# Patient Record
Sex: Male | Born: 1940 | Race: White | Hispanic: No | Marital: Married | Smoking: Former smoker
Health system: Southern US, Community
[De-identification: ages and names within clinical notes are randomized; demographics above are authoritative.]

## PROBLEM LIST (undated history)

## (undated) DIAGNOSIS — M545 Low back pain, unspecified: Secondary | ICD-10-CM

## (undated) DIAGNOSIS — C4491 Basal cell carcinoma of skin, unspecified: Secondary | ICD-10-CM

## (undated) DIAGNOSIS — R091 Pleurisy: Secondary | ICD-10-CM

## (undated) HISTORY — DX: Low back pain: M54.5

## (undated) HISTORY — PX: APPENDECTOMY: SHX54

## (undated) HISTORY — DX: Basal cell carcinoma of skin, unspecified: C44.91

## (undated) HISTORY — DX: Low back pain, unspecified: M54.50

## (undated) HISTORY — DX: Pleurisy: R09.1

---

## 2004-12-08 ENCOUNTER — Ambulatory Visit: Payer: Self-pay | Admitting: General Surgery

## 2004-12-08 LAB — HM COLONOSCOPY

## 2008-11-02 ENCOUNTER — Ambulatory Visit: Payer: Self-pay | Admitting: Family Medicine

## 2008-12-20 DIAGNOSIS — I10 Essential (primary) hypertension: Secondary | ICD-10-CM | POA: Insufficient documentation

## 2009-11-01 DIAGNOSIS — M109 Gout, unspecified: Secondary | ICD-10-CM | POA: Insufficient documentation

## 2009-11-06 DIAGNOSIS — IMO0002 Reserved for concepts with insufficient information to code with codable children: Secondary | ICD-10-CM | POA: Insufficient documentation

## 2009-11-29 ENCOUNTER — Ambulatory Visit: Payer: Self-pay | Admitting: Family Medicine

## 2009-11-29 DIAGNOSIS — R091 Pleurisy: Secondary | ICD-10-CM

## 2009-11-29 HISTORY — DX: Pleurisy: R09.1

## 2010-01-23 DIAGNOSIS — G4700 Insomnia, unspecified: Secondary | ICD-10-CM | POA: Insufficient documentation

## 2011-08-23 ENCOUNTER — Ambulatory Visit: Payer: Self-pay | Admitting: General Surgery

## 2011-10-09 DIAGNOSIS — L819 Disorder of pigmentation, unspecified: Secondary | ICD-10-CM | POA: Diagnosis not present

## 2011-10-09 DIAGNOSIS — D239 Other benign neoplasm of skin, unspecified: Secondary | ICD-10-CM | POA: Diagnosis not present

## 2011-10-09 DIAGNOSIS — D1801 Hemangioma of skin and subcutaneous tissue: Secondary | ICD-10-CM | POA: Diagnosis not present

## 2011-10-09 DIAGNOSIS — L570 Actinic keratosis: Secondary | ICD-10-CM | POA: Diagnosis not present

## 2011-10-09 DIAGNOSIS — Z85828 Personal history of other malignant neoplasm of skin: Secondary | ICD-10-CM | POA: Diagnosis not present

## 2011-10-09 DIAGNOSIS — L738 Other specified follicular disorders: Secondary | ICD-10-CM | POA: Diagnosis not present

## 2011-10-09 DIAGNOSIS — L408 Other psoriasis: Secondary | ICD-10-CM | POA: Diagnosis not present

## 2011-10-09 DIAGNOSIS — L821 Other seborrheic keratosis: Secondary | ICD-10-CM | POA: Diagnosis not present

## 2011-10-09 DIAGNOSIS — D485 Neoplasm of uncertain behavior of skin: Secondary | ICD-10-CM | POA: Diagnosis not present

## 2011-11-09 DIAGNOSIS — N400 Enlarged prostate without lower urinary tract symptoms: Secondary | ICD-10-CM | POA: Diagnosis not present

## 2011-11-09 DIAGNOSIS — Z0000 Encounter for general adult medical examination without abnormal findings: Secondary | ICD-10-CM | POA: Diagnosis not present

## 2011-11-09 DIAGNOSIS — I10 Essential (primary) hypertension: Secondary | ICD-10-CM | POA: Diagnosis not present

## 2011-11-09 DIAGNOSIS — N529 Male erectile dysfunction, unspecified: Secondary | ICD-10-CM | POA: Diagnosis not present

## 2011-11-09 DIAGNOSIS — Z13220 Encounter for screening for lipoid disorders: Secondary | ICD-10-CM | POA: Diagnosis not present

## 2011-11-09 DIAGNOSIS — Z125 Encounter for screening for malignant neoplasm of prostate: Secondary | ICD-10-CM | POA: Diagnosis not present

## 2012-04-08 DIAGNOSIS — D1801 Hemangioma of skin and subcutaneous tissue: Secondary | ICD-10-CM | POA: Diagnosis not present

## 2012-04-08 DIAGNOSIS — D239 Other benign neoplasm of skin, unspecified: Secondary | ICD-10-CM | POA: Diagnosis not present

## 2012-04-08 DIAGNOSIS — L570 Actinic keratosis: Secondary | ICD-10-CM | POA: Diagnosis not present

## 2012-04-08 DIAGNOSIS — D485 Neoplasm of uncertain behavior of skin: Secondary | ICD-10-CM | POA: Diagnosis not present

## 2012-04-08 DIAGNOSIS — L819 Disorder of pigmentation, unspecified: Secondary | ICD-10-CM | POA: Diagnosis not present

## 2012-04-08 DIAGNOSIS — L821 Other seborrheic keratosis: Secondary | ICD-10-CM | POA: Diagnosis not present

## 2012-04-08 DIAGNOSIS — L408 Other psoriasis: Secondary | ICD-10-CM | POA: Diagnosis not present

## 2012-04-08 DIAGNOSIS — L719 Rosacea, unspecified: Secondary | ICD-10-CM | POA: Diagnosis not present

## 2012-04-08 DIAGNOSIS — Z85828 Personal history of other malignant neoplasm of skin: Secondary | ICD-10-CM | POA: Diagnosis not present

## 2012-05-22 DIAGNOSIS — R339 Retention of urine, unspecified: Secondary | ICD-10-CM | POA: Diagnosis not present

## 2012-05-22 DIAGNOSIS — I10 Essential (primary) hypertension: Secondary | ICD-10-CM | POA: Diagnosis not present

## 2012-05-22 DIAGNOSIS — R42 Dizziness and giddiness: Secondary | ICD-10-CM | POA: Diagnosis not present

## 2012-05-22 DIAGNOSIS — R5383 Other fatigue: Secondary | ICD-10-CM | POA: Diagnosis not present

## 2012-05-22 DIAGNOSIS — R5381 Other malaise: Secondary | ICD-10-CM | POA: Diagnosis not present

## 2012-06-06 DIAGNOSIS — R351 Nocturia: Secondary | ICD-10-CM | POA: Diagnosis not present

## 2012-06-06 DIAGNOSIS — R339 Retention of urine, unspecified: Secondary | ICD-10-CM | POA: Diagnosis not present

## 2012-06-06 DIAGNOSIS — N401 Enlarged prostate with lower urinary tract symptoms: Secondary | ICD-10-CM | POA: Diagnosis not present

## 2012-06-08 DIAGNOSIS — R351 Nocturia: Secondary | ICD-10-CM | POA: Insufficient documentation

## 2012-07-02 DIAGNOSIS — D485 Neoplasm of uncertain behavior of skin: Secondary | ICD-10-CM | POA: Diagnosis not present

## 2012-07-02 DIAGNOSIS — L723 Sebaceous cyst: Secondary | ICD-10-CM | POA: Diagnosis not present

## 2012-07-02 DIAGNOSIS — Z23 Encounter for immunization: Secondary | ICD-10-CM | POA: Diagnosis not present

## 2012-07-11 DIAGNOSIS — N401 Enlarged prostate with lower urinary tract symptoms: Secondary | ICD-10-CM | POA: Diagnosis not present

## 2012-07-11 DIAGNOSIS — R339 Retention of urine, unspecified: Secondary | ICD-10-CM | POA: Diagnosis not present

## 2012-09-11 DIAGNOSIS — Z87891 Personal history of nicotine dependence: Secondary | ICD-10-CM | POA: Diagnosis not present

## 2012-09-11 DIAGNOSIS — H8110 Benign paroxysmal vertigo, unspecified ear: Secondary | ICD-10-CM | POA: Insufficient documentation

## 2012-09-11 DIAGNOSIS — L409 Psoriasis, unspecified: Secondary | ICD-10-CM | POA: Insufficient documentation

## 2012-09-11 DIAGNOSIS — J329 Chronic sinusitis, unspecified: Secondary | ICD-10-CM | POA: Diagnosis not present

## 2012-09-11 DIAGNOSIS — Z8042 Family history of malignant neoplasm of prostate: Secondary | ICD-10-CM | POA: Insufficient documentation

## 2012-09-11 DIAGNOSIS — Z6829 Body mass index (BMI) 29.0-29.9, adult: Secondary | ICD-10-CM | POA: Diagnosis not present

## 2012-10-02 DIAGNOSIS — J3489 Other specified disorders of nose and nasal sinuses: Secondary | ICD-10-CM | POA: Diagnosis not present

## 2012-10-02 DIAGNOSIS — Z6830 Body mass index (BMI) 30.0-30.9, adult: Secondary | ICD-10-CM | POA: Diagnosis not present

## 2012-10-02 DIAGNOSIS — Z87891 Personal history of nicotine dependence: Secondary | ICD-10-CM | POA: Diagnosis not present

## 2012-11-14 DIAGNOSIS — Z85828 Personal history of other malignant neoplasm of skin: Secondary | ICD-10-CM | POA: Diagnosis not present

## 2012-11-14 DIAGNOSIS — D1801 Hemangioma of skin and subcutaneous tissue: Secondary | ICD-10-CM | POA: Diagnosis not present

## 2012-11-14 DIAGNOSIS — D239 Other benign neoplasm of skin, unspecified: Secondary | ICD-10-CM | POA: Diagnosis not present

## 2012-11-14 DIAGNOSIS — L821 Other seborrheic keratosis: Secondary | ICD-10-CM | POA: Diagnosis not present

## 2012-11-14 DIAGNOSIS — L819 Disorder of pigmentation, unspecified: Secondary | ICD-10-CM | POA: Diagnosis not present

## 2012-11-14 DIAGNOSIS — L408 Other psoriasis: Secondary | ICD-10-CM | POA: Diagnosis not present

## 2012-11-14 DIAGNOSIS — L719 Rosacea, unspecified: Secondary | ICD-10-CM | POA: Diagnosis not present

## 2012-12-19 DIAGNOSIS — Z0000 Encounter for general adult medical examination without abnormal findings: Secondary | ICD-10-CM | POA: Diagnosis not present

## 2012-12-19 DIAGNOSIS — R339 Retention of urine, unspecified: Secondary | ICD-10-CM | POA: Diagnosis not present

## 2012-12-19 DIAGNOSIS — Z1331 Encounter for screening for depression: Secondary | ICD-10-CM | POA: Diagnosis not present

## 2012-12-19 DIAGNOSIS — Z125 Encounter for screening for malignant neoplasm of prostate: Secondary | ICD-10-CM | POA: Diagnosis not present

## 2012-12-19 DIAGNOSIS — I10 Essential (primary) hypertension: Secondary | ICD-10-CM | POA: Diagnosis not present

## 2012-12-19 DIAGNOSIS — R5383 Other fatigue: Secondary | ICD-10-CM | POA: Diagnosis not present

## 2012-12-19 DIAGNOSIS — Z1339 Encounter for screening examination for other mental health and behavioral disorders: Secondary | ICD-10-CM | POA: Diagnosis not present

## 2013-01-12 DIAGNOSIS — H40039 Anatomical narrow angle, unspecified eye: Secondary | ICD-10-CM | POA: Diagnosis not present

## 2013-01-12 DIAGNOSIS — H10029 Other mucopurulent conjunctivitis, unspecified eye: Secondary | ICD-10-CM | POA: Diagnosis not present

## 2013-03-05 DIAGNOSIS — N401 Enlarged prostate with lower urinary tract symptoms: Secondary | ICD-10-CM | POA: Diagnosis not present

## 2013-03-05 DIAGNOSIS — R339 Retention of urine, unspecified: Secondary | ICD-10-CM | POA: Diagnosis not present

## 2013-04-07 ENCOUNTER — Ambulatory Visit: Payer: Self-pay | Admitting: Family Medicine

## 2013-04-07 DIAGNOSIS — M79609 Pain in unspecified limb: Secondary | ICD-10-CM | POA: Diagnosis not present

## 2013-04-07 DIAGNOSIS — I10 Essential (primary) hypertension: Secondary | ICD-10-CM | POA: Diagnosis not present

## 2013-04-07 DIAGNOSIS — J309 Allergic rhinitis, unspecified: Secondary | ICD-10-CM | POA: Diagnosis not present

## 2013-04-07 DIAGNOSIS — R05 Cough: Secondary | ICD-10-CM | POA: Diagnosis not present

## 2013-04-07 DIAGNOSIS — R339 Retention of urine, unspecified: Secondary | ICD-10-CM | POA: Diagnosis not present

## 2013-04-07 LAB — LIPID PANEL
BUN: 24
Creat: 1.2
GLUCOSE: 109
POTASSIUM: 4.7 mmol/L
SODIUM: 139

## 2013-05-30 DIAGNOSIS — H00029 Hordeolum internum unspecified eye, unspecified eyelid: Secondary | ICD-10-CM | POA: Diagnosis not present

## 2013-06-01 DIAGNOSIS — H00029 Hordeolum internum unspecified eye, unspecified eyelid: Secondary | ICD-10-CM | POA: Diagnosis not present

## 2013-06-03 ENCOUNTER — Encounter (INDEPENDENT_AMBULATORY_CARE_PROVIDER_SITE_OTHER): Payer: Medicare Other | Admitting: Ophthalmology

## 2013-06-03 DIAGNOSIS — I10 Essential (primary) hypertension: Secondary | ICD-10-CM | POA: Diagnosis not present

## 2013-06-03 DIAGNOSIS — H43819 Vitreous degeneration, unspecified eye: Secondary | ICD-10-CM | POA: Diagnosis not present

## 2013-06-03 DIAGNOSIS — H35039 Hypertensive retinopathy, unspecified eye: Secondary | ICD-10-CM

## 2013-06-03 DIAGNOSIS — H3530 Unspecified macular degeneration: Secondary | ICD-10-CM

## 2013-06-03 DIAGNOSIS — H2510 Age-related nuclear cataract, unspecified eye: Secondary | ICD-10-CM

## 2013-06-06 DIAGNOSIS — H16229 Keratoconjunctivitis sicca, not specified as Sjogren's, unspecified eye: Secondary | ICD-10-CM | POA: Diagnosis not present

## 2013-08-26 DIAGNOSIS — D485 Neoplasm of uncertain behavior of skin: Secondary | ICD-10-CM | POA: Diagnosis not present

## 2013-08-27 DIAGNOSIS — C44319 Basal cell carcinoma of skin of other parts of face: Secondary | ICD-10-CM | POA: Diagnosis not present

## 2013-08-31 DIAGNOSIS — J069 Acute upper respiratory infection, unspecified: Secondary | ICD-10-CM | POA: Diagnosis not present

## 2013-08-31 DIAGNOSIS — J209 Acute bronchitis, unspecified: Secondary | ICD-10-CM | POA: Diagnosis not present

## 2013-09-21 DIAGNOSIS — R05 Cough: Secondary | ICD-10-CM | POA: Diagnosis not present

## 2013-09-21 DIAGNOSIS — Z79899 Other long term (current) drug therapy: Secondary | ICD-10-CM | POA: Diagnosis not present

## 2013-09-21 DIAGNOSIS — Z6830 Body mass index (BMI) 30.0-30.9, adult: Secondary | ICD-10-CM | POA: Diagnosis not present

## 2013-09-21 DIAGNOSIS — Z87891 Personal history of nicotine dependence: Secondary | ICD-10-CM | POA: Diagnosis not present

## 2013-10-06 DIAGNOSIS — C44319 Basal cell carcinoma of skin of other parts of face: Secondary | ICD-10-CM | POA: Diagnosis not present

## 2013-11-02 DIAGNOSIS — R059 Cough, unspecified: Secondary | ICD-10-CM | POA: Diagnosis not present

## 2013-11-02 DIAGNOSIS — J309 Allergic rhinitis, unspecified: Secondary | ICD-10-CM | POA: Diagnosis not present

## 2013-11-02 DIAGNOSIS — K219 Gastro-esophageal reflux disease without esophagitis: Secondary | ICD-10-CM | POA: Diagnosis not present

## 2013-11-02 DIAGNOSIS — R339 Retention of urine, unspecified: Secondary | ICD-10-CM | POA: Diagnosis not present

## 2013-11-02 DIAGNOSIS — R05 Cough: Secondary | ICD-10-CM | POA: Diagnosis not present

## 2013-12-02 DIAGNOSIS — L570 Actinic keratosis: Secondary | ICD-10-CM | POA: Diagnosis not present

## 2013-12-02 DIAGNOSIS — D1801 Hemangioma of skin and subcutaneous tissue: Secondary | ICD-10-CM | POA: Diagnosis not present

## 2013-12-02 DIAGNOSIS — L719 Rosacea, unspecified: Secondary | ICD-10-CM | POA: Diagnosis not present

## 2013-12-02 DIAGNOSIS — D239 Other benign neoplasm of skin, unspecified: Secondary | ICD-10-CM | POA: Diagnosis not present

## 2013-12-02 DIAGNOSIS — Z85828 Personal history of other malignant neoplasm of skin: Secondary | ICD-10-CM | POA: Diagnosis not present

## 2013-12-02 DIAGNOSIS — L819 Disorder of pigmentation, unspecified: Secondary | ICD-10-CM | POA: Diagnosis not present

## 2013-12-02 DIAGNOSIS — L821 Other seborrheic keratosis: Secondary | ICD-10-CM | POA: Diagnosis not present

## 2013-12-02 DIAGNOSIS — D485 Neoplasm of uncertain behavior of skin: Secondary | ICD-10-CM | POA: Diagnosis not present

## 2013-12-03 DIAGNOSIS — L570 Actinic keratosis: Secondary | ICD-10-CM | POA: Diagnosis not present

## 2013-12-21 DIAGNOSIS — Z23 Encounter for immunization: Secondary | ICD-10-CM | POA: Diagnosis not present

## 2013-12-21 DIAGNOSIS — Z0000 Encounter for general adult medical examination without abnormal findings: Secondary | ICD-10-CM | POA: Diagnosis not present

## 2013-12-21 DIAGNOSIS — Z1339 Encounter for screening examination for other mental health and behavioral disorders: Secondary | ICD-10-CM | POA: Diagnosis not present

## 2013-12-21 DIAGNOSIS — R05 Cough: Secondary | ICD-10-CM | POA: Diagnosis not present

## 2013-12-21 DIAGNOSIS — Z1331 Encounter for screening for depression: Secondary | ICD-10-CM | POA: Diagnosis not present

## 2013-12-21 DIAGNOSIS — I10 Essential (primary) hypertension: Secondary | ICD-10-CM | POA: Diagnosis not present

## 2013-12-21 DIAGNOSIS — R339 Retention of urine, unspecified: Secondary | ICD-10-CM | POA: Diagnosis not present

## 2013-12-21 DIAGNOSIS — R059 Cough, unspecified: Secondary | ICD-10-CM | POA: Diagnosis not present

## 2013-12-21 DIAGNOSIS — J309 Allergic rhinitis, unspecified: Secondary | ICD-10-CM | POA: Diagnosis not present

## 2013-12-21 LAB — LIPID PANEL
BUN: 17
CREATININE: 1.3
Cholesterol, Total: 212
Glucose: 102
HDL: 52 mg/dL (ref 35–70)
LDL Cholesterol: 135 mg/dL
POTASSIUM: 4 mmol/L
Sodium: 139
Triglycerides: 126

## 2014-03-18 DIAGNOSIS — D485 Neoplasm of uncertain behavior of skin: Secondary | ICD-10-CM | POA: Diagnosis not present

## 2014-03-18 DIAGNOSIS — L570 Actinic keratosis: Secondary | ICD-10-CM | POA: Diagnosis not present

## 2014-03-18 DIAGNOSIS — IMO0002 Reserved for concepts with insufficient information to code with codable children: Secondary | ICD-10-CM | POA: Insufficient documentation

## 2014-03-22 DIAGNOSIS — C44621 Squamous cell carcinoma of skin of unspecified upper limb, including shoulder: Secondary | ICD-10-CM | POA: Diagnosis not present

## 2014-04-14 DIAGNOSIS — L570 Actinic keratosis: Secondary | ICD-10-CM | POA: Diagnosis not present

## 2014-04-14 DIAGNOSIS — C44621 Squamous cell carcinoma of skin of unspecified upper limb, including shoulder: Secondary | ICD-10-CM | POA: Diagnosis not present

## 2014-04-15 DIAGNOSIS — L905 Scar conditions and fibrosis of skin: Secondary | ICD-10-CM | POA: Diagnosis not present

## 2014-04-25 IMAGING — CR RIGHT HAND - COMPLETE 3+ VIEW
1 series · 3 of 3 positions shown · non-contrast
Comparison: none

REASON FOR EXAM: pain
COMMENTS:

PROCEDURE:     KDR - KDXR HAND RT COMPLETE W/OBLIQUES  - April 07, 2013  [DATE]
RESULT:     Images of the right hand demonstrate no fracture, dislocation or
radiopaque foreign body.

[Series 1: pa · 0.17mm/px · 3 of 3 slices shown]
[im 1/3]
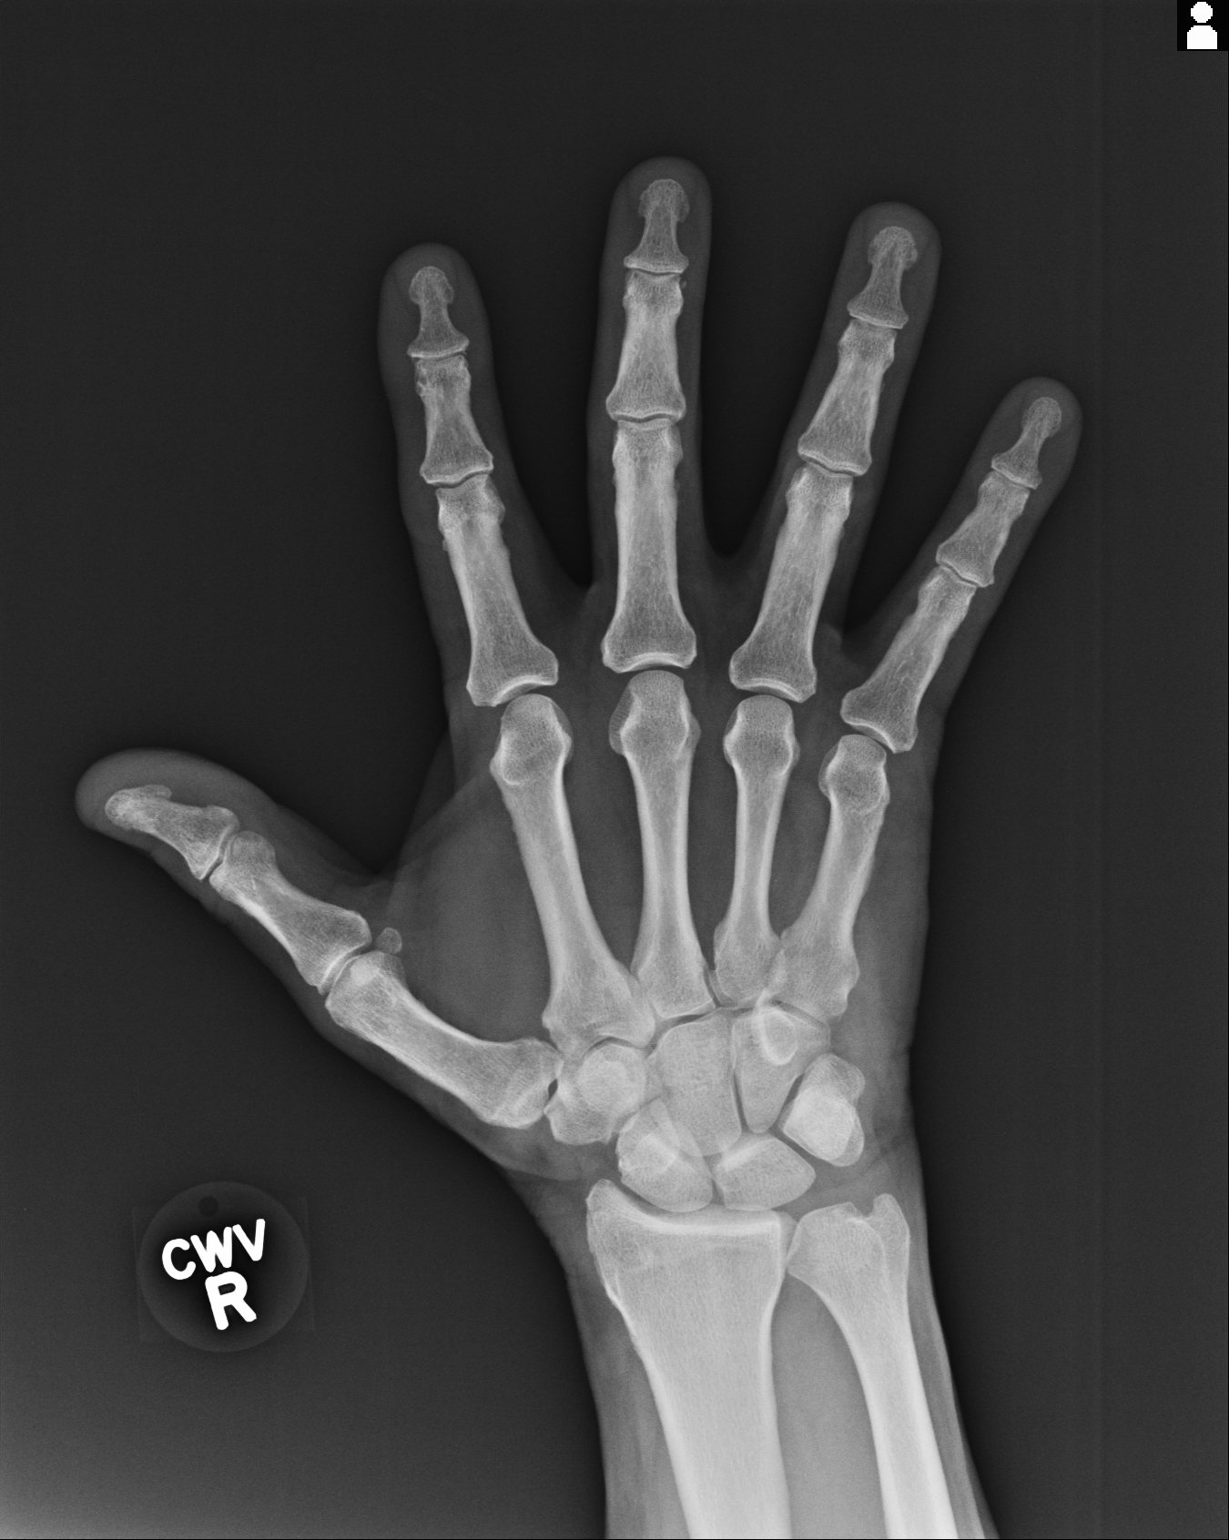
[im 2/3]
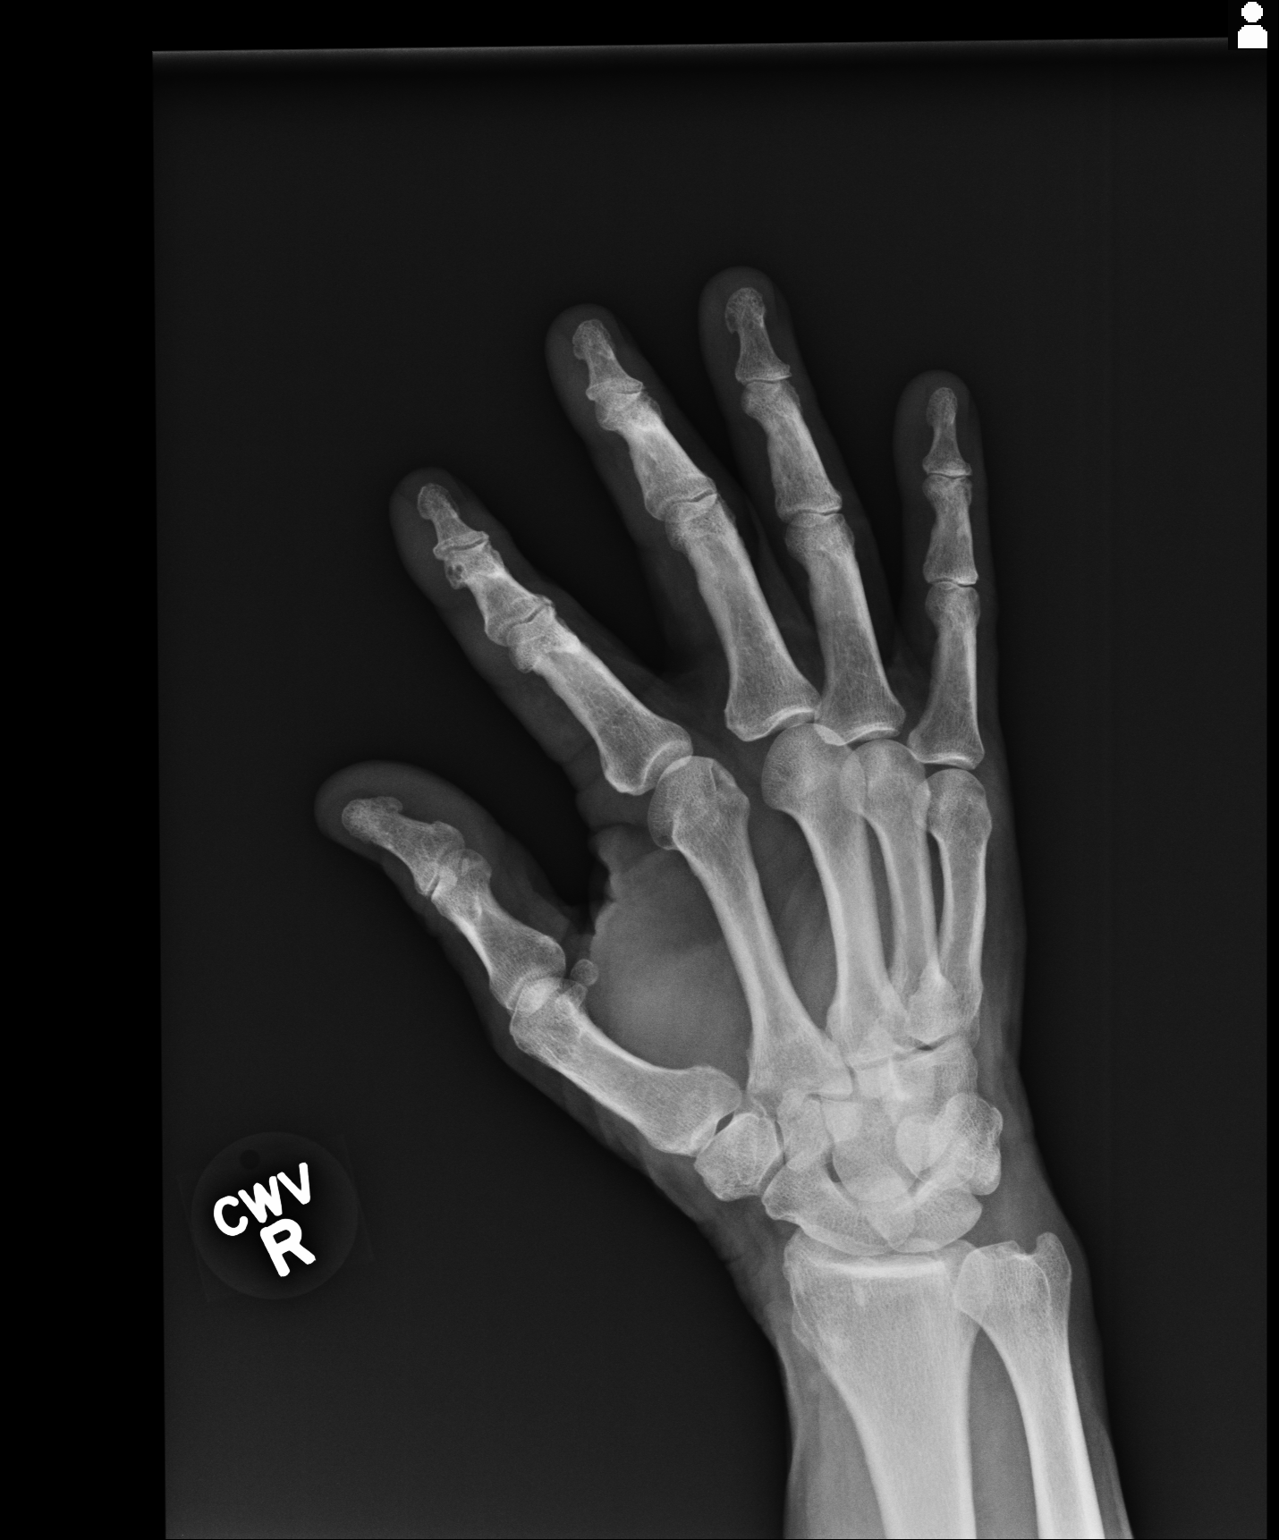
[im 3/3]
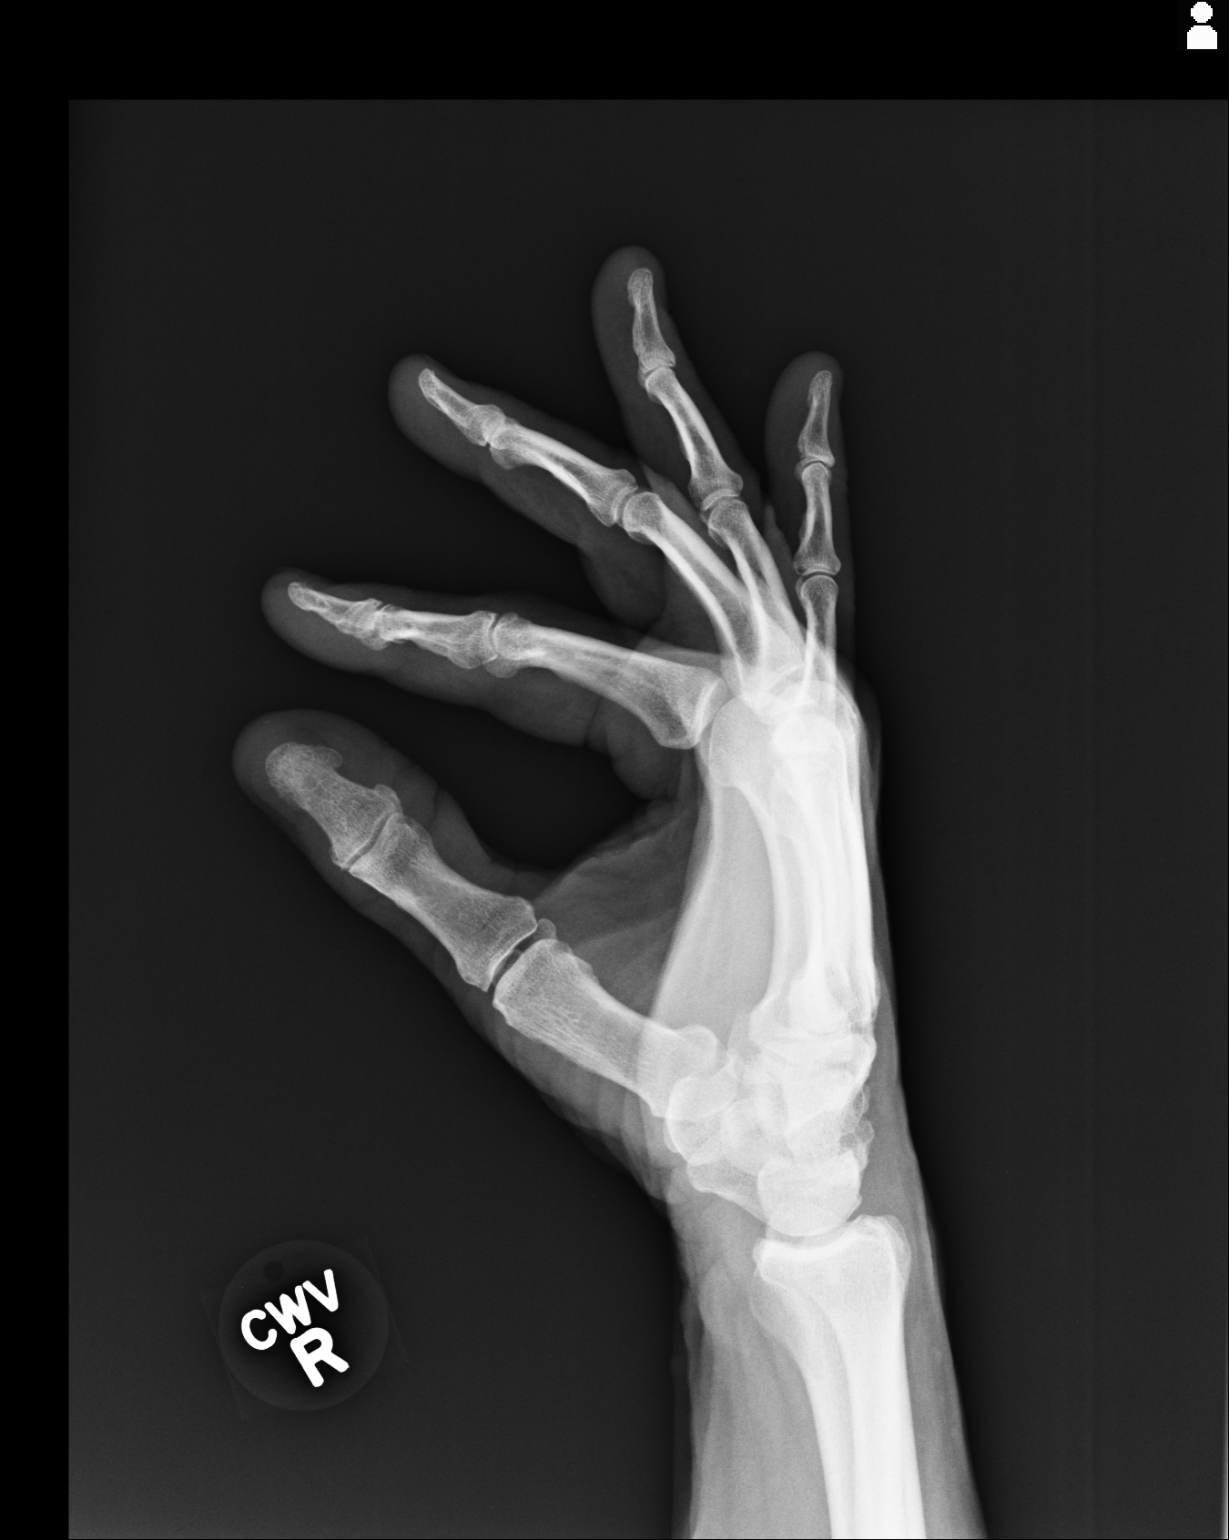

[3 of 3 positions shown; findings below may reference images not displayed]

IMPRESSION: Please see above.

[REDACTED]

## 2014-05-25 DIAGNOSIS — L0889 Other specified local infections of the skin and subcutaneous tissue: Secondary | ICD-10-CM | POA: Diagnosis not present

## 2014-06-17 DIAGNOSIS — J309 Allergic rhinitis, unspecified: Secondary | ICD-10-CM | POA: Diagnosis not present

## 2014-06-17 DIAGNOSIS — R5381 Other malaise: Secondary | ICD-10-CM | POA: Diagnosis not present

## 2014-06-17 DIAGNOSIS — R339 Retention of urine, unspecified: Secondary | ICD-10-CM | POA: Diagnosis not present

## 2014-06-17 DIAGNOSIS — K219 Gastro-esophageal reflux disease without esophagitis: Secondary | ICD-10-CM | POA: Diagnosis not present

## 2014-06-17 DIAGNOSIS — Z23 Encounter for immunization: Secondary | ICD-10-CM | POA: Diagnosis not present

## 2014-06-17 DIAGNOSIS — R5383 Other fatigue: Secondary | ICD-10-CM | POA: Diagnosis not present

## 2014-06-17 LAB — LIPID PANEL
ALT: 29
AST: 28 U/L
BUN: 16
Creat: 1.2
Glucose: 99
HCT: 40.3
HGB: 14.4 g/dL
POTASSIUM: 4.3 mmol/L
Platelets: 188
SODIUM: 138
TSH: 3.78
WBC: 8.6

## 2014-07-29 DIAGNOSIS — M109 Gout, unspecified: Secondary | ICD-10-CM | POA: Diagnosis not present

## 2014-07-29 DIAGNOSIS — Z23 Encounter for immunization: Secondary | ICD-10-CM | POA: Diagnosis not present

## 2014-08-25 DIAGNOSIS — J0100 Acute maxillary sinusitis, unspecified: Secondary | ICD-10-CM | POA: Diagnosis not present

## 2014-12-07 DIAGNOSIS — Z85828 Personal history of other malignant neoplasm of skin: Secondary | ICD-10-CM | POA: Diagnosis not present

## 2014-12-07 DIAGNOSIS — L400 Psoriasis vulgaris: Secondary | ICD-10-CM | POA: Diagnosis not present

## 2014-12-07 DIAGNOSIS — L821 Other seborrheic keratosis: Secondary | ICD-10-CM | POA: Diagnosis not present

## 2014-12-07 DIAGNOSIS — D1800 Hemangioma unspecified site: Secondary | ICD-10-CM | POA: Diagnosis not present

## 2014-12-07 DIAGNOSIS — L570 Actinic keratosis: Secondary | ICD-10-CM | POA: Diagnosis not present

## 2014-12-07 DIAGNOSIS — L719 Rosacea, unspecified: Secondary | ICD-10-CM | POA: Diagnosis not present

## 2014-12-07 DIAGNOSIS — L814 Other melanin hyperpigmentation: Secondary | ICD-10-CM | POA: Diagnosis not present

## 2015-01-28 DIAGNOSIS — H539 Unspecified visual disturbance: Secondary | ICD-10-CM | POA: Diagnosis not present

## 2015-01-28 DIAGNOSIS — N400 Enlarged prostate without lower urinary tract symptoms: Secondary | ICD-10-CM | POA: Diagnosis not present

## 2015-01-28 DIAGNOSIS — E785 Hyperlipidemia, unspecified: Secondary | ICD-10-CM | POA: Diagnosis not present

## 2015-01-28 DIAGNOSIS — G3184 Mild cognitive impairment, so stated: Secondary | ICD-10-CM | POA: Diagnosis not present

## 2015-01-28 DIAGNOSIS — S161XXA Strain of muscle, fascia and tendon at neck level, initial encounter: Secondary | ICD-10-CM | POA: Diagnosis not present

## 2015-01-28 DIAGNOSIS — R5383 Other fatigue: Secondary | ICD-10-CM | POA: Diagnosis not present

## 2015-01-28 LAB — LIPID PANEL
ALT: 22
AST: 28 U/L
BUN: 16
CHOLESTEROL, TOTAL: 200
CREATININE: 1.07
Glucose: 103
HCT: 42.1
HDL: 59 mg/dL (ref 35–70)
HGB: 15 g/dL
LDL CALC: 110 mg/dL
POTASSIUM: 4.5 mmol/L
PSA: 1.1
Platelets: 208
SODIUM: 137
TSH: 4.21
Triglycerides: 157
VITAMIN D 1, 25 (OH) TOTAL: 43.8
WBC: 7.9

## 2015-02-01 DIAGNOSIS — G44219 Episodic tension-type headache, not intractable: Secondary | ICD-10-CM | POA: Diagnosis not present

## 2015-02-16 ENCOUNTER — Encounter: Payer: Self-pay | Admitting: *Deleted

## 2015-02-16 DIAGNOSIS — K219 Gastro-esophageal reflux disease without esophagitis: Secondary | ICD-10-CM | POA: Insufficient documentation

## 2015-02-16 DIAGNOSIS — R5383 Other fatigue: Secondary | ICD-10-CM | POA: Insufficient documentation

## 2015-02-16 DIAGNOSIS — M545 Low back pain, unspecified: Secondary | ICD-10-CM | POA: Insufficient documentation

## 2015-02-16 DIAGNOSIS — M79646 Pain in unspecified finger(s): Secondary | ICD-10-CM | POA: Insufficient documentation

## 2015-02-16 DIAGNOSIS — E785 Hyperlipidemia, unspecified: Secondary | ICD-10-CM | POA: Insufficient documentation

## 2015-02-16 DIAGNOSIS — N400 Enlarged prostate without lower urinary tract symptoms: Secondary | ICD-10-CM | POA: Insufficient documentation

## 2015-02-16 DIAGNOSIS — R3989 Other symptoms and signs involving the genitourinary system: Secondary | ICD-10-CM | POA: Insufficient documentation

## 2015-02-16 DIAGNOSIS — S161XXA Strain of muscle, fascia and tendon at neck level, initial encounter: Secondary | ICD-10-CM | POA: Insufficient documentation

## 2015-02-16 DIAGNOSIS — C4491 Basal cell carcinoma of skin, unspecified: Secondary | ICD-10-CM | POA: Insufficient documentation

## 2015-02-16 DIAGNOSIS — F4321 Adjustment disorder with depressed mood: Secondary | ICD-10-CM | POA: Insufficient documentation

## 2015-02-16 DIAGNOSIS — R222 Localized swelling, mass and lump, trunk: Secondary | ICD-10-CM | POA: Insufficient documentation

## 2015-02-16 DIAGNOSIS — J309 Allergic rhinitis, unspecified: Secondary | ICD-10-CM | POA: Insufficient documentation

## 2015-02-16 DIAGNOSIS — N529 Male erectile dysfunction, unspecified: Secondary | ICD-10-CM | POA: Insufficient documentation

## 2015-02-16 DIAGNOSIS — R413 Other amnesia: Secondary | ICD-10-CM | POA: Insufficient documentation

## 2015-02-16 DIAGNOSIS — H539 Unspecified visual disturbance: Secondary | ICD-10-CM | POA: Insufficient documentation

## 2015-03-08 ENCOUNTER — Encounter: Payer: Self-pay | Admitting: Family Medicine

## 2015-03-08 ENCOUNTER — Ambulatory Visit (INDEPENDENT_AMBULATORY_CARE_PROVIDER_SITE_OTHER): Payer: Medicare Other | Admitting: Family Medicine

## 2015-03-08 VITALS — BP 112/70 | HR 80 | Temp 98.6°F | Resp 16 | Wt 195.0 lb

## 2015-03-08 DIAGNOSIS — E291 Testicular hypofunction: Secondary | ICD-10-CM

## 2015-03-08 DIAGNOSIS — G4700 Insomnia, unspecified: Secondary | ICD-10-CM | POA: Diagnosis not present

## 2015-03-08 DIAGNOSIS — Z1389 Encounter for screening for other disorder: Secondary | ICD-10-CM

## 2015-03-08 DIAGNOSIS — I10 Essential (primary) hypertension: Secondary | ICD-10-CM

## 2015-03-08 DIAGNOSIS — R7989 Other specified abnormal findings of blood chemistry: Secondary | ICD-10-CM

## 2015-03-08 MED ORDER — NORTRIPTYLINE HCL 10 MG PO CAPS: ORAL_CAPSULE | ORAL | Status: DC

## 2015-03-08 NOTE — Progress Notes (Signed)
Patient: Chad Thompson Male    DOB: 07-14-1941   74 y.o.   MRN: [id number] Visit Date: 03/08/2015  Today's Provider: Lelon Huh, MD   Chief Complaint  Patient presents with  . Abnormal Lab    Low testoterone   Subjective:    HPI    Low testosterone Patient is here today to follow up on Low Testosterone levels. Patient was last seen  01/28/2015. Labs were ordered. No changes were made. Patient was to have Total Testosterone  and Free Testosterone rechecked in 1 month since levels tend to Fluctuate.     Insomnia Follow up  He states he only takes 1 amitriptylline which works very well, but causes severe dry mouth.  Insomnia is getting unchanged. He has trouble sleeping several times a week.    He is taking OTC sleeping aid. melatonin. He is taking medications to help sleep. He is not drinking alcohol to help sleep. He is not using illicit drugs.  --------------------------------------------------------------------  Hypertension, follow-up:  BP Readings from Last 3 Encounters:  03/08/15 112/70  01/28/15 122/70    He was last seen for hypertension 3 months ago.   No Management changes since that visit He reports excellent compliance with treatment. He is having side effects. fatigue  He is not exercising. He is adherent to low salt diet.   Outside blood pressures are low.Marland Kitchen He is experiencing fatigue.  Patient denies chest pain, exertional chest pressure/discomfort, irregular heart beat and palpitations.   Cardiovascular risk factors include advanced age (older than 45 for men, 60 for women) and hypertension.  Use of agents associated with hypertension: none.     Wt Readings from Last 3 Encounters:  03/08/15 195 lb (88.451 kg)  01/28/15 202 lb (91.627 kg)    Current diet: in general, a "healthy" diet    ------------------------------------------------------------------------        Previous Medications   AMITRIPTYLINE (ELAVIL) 10 MG TABLET    Take  1-3 tablets by mouth at bedtime.   CETIRIZINE (ZYRTEC) 10 MG TABLET    Take 1 tablet by mouth daily.   COLCHICINE 0.6 MG TABLET    Take 1 tablet by mouth 3 (three) times daily as needed.   FLUOCINONIDE CREAM (LIDEX) 0.05 %    Apply 1 application topically 2 (two) times daily as needed.   MELOXICAM (MOBIC) 15 MG TABLET    Take 1 tablet by mouth daily.   MONTELUKAST (SINGULAIR) 10 MG TABLET    Take 1 tablet by mouth daily.   MULTIPLE VITAMIN PO    Take 1 tablet by mouth daily.   OMEPRAZOLE (PRILOSEC) 20 MG CAPSULE    Take 1 capsule by mouth daily.   PROBENECID (BENEMID) 500 MG TABLET    Take 1 tablet by mouth daily.   TERAZOSIN (HYTRIN) 1 MG CAPSULE    Take 1 capsule by mouth at bedtime.   VALSARTAN-HYDROCHLOROTHIAZIDE (DIOVAN-HCT) 160-12.5 MG PER TABLET    Take 0.5 tablets by mouth daily.    Review of Systems  Constitutional: Positive for fatigue. Negative for fever, chills and appetite change.  Respiratory: Negative for chest tightness, shortness of breath and wheezing.   Cardiovascular: Negative for chest pain and palpitations.  Gastrointestinal: Negative for nausea, vomiting and abdominal pain.  Endocrine:       Dry mouth    History  Substance Use Topics  . Smoking status: Former Smoker -- 1.50 packs/day for 30 years    Types: Cigarettes  . Smokeless tobacco: Former Systems developer  Quit date: 09/25/1983  . Alcohol Use: 2.4 oz/week    0 Standard drinks or equivalent, 4 Glasses of wine per week   Objective:     Filed Vitals:   03/08/15 1106 03/08/15 1139  BP: 102/60 112/70  Pulse: 80   Temp: 98.6 F (37 C)   TempSrc: Oral   Resp: 16   Weight: 195 lb (88.451 kg)   SpO2: 96%      Physical Exam  General Appearance:    Alert, cooperative, no distress  Eyes:    PERRL, conjunctiva/corneas clear, EOM's intact       Lungs:     Clear to auscultation bilaterally, respirations unlabored  Heart:    Regular rate and rhythm  Neurologic:   Awake, alert, oriented x 3. No apparent focal  neurological           defect.            Assessment & Plan:     1. Hypertension, essential BP running a little low. Consider reducing valsartan unless we start testosterone replacement.   2. Insomnia Amitriptylline working very well, but causing severe dry mouth. Did not do well with Ambien, trazodone, or temazepam in the past. Discussed options of trial of Belsomra verus changing to a different TCA. Will switch to nortriptyline to see if better tolerated than amitriptylline.  - nortriptyline (PAMELOR) 10 MG capsule; 1-2 tablets at bedtime, as needed for insomnia  Dispense: 30 capsule; Refill: 5  3. Low testosterone Repeat levels to see if replacement would be  Beneficial.  - Testosterone,Free and Total - FSH - Prolactin  4. Fatigue Recent labs were normal aside from low testosterone which we will recheck as above. Consider reducing BP medications unless we have to start testosterone replacement.

## 2015-03-09 LAB — FOLLICLE STIMULATING HORMONE: FSH: 3.7 m[IU]/mL (ref 1.5–12.4)

## 2015-03-09 LAB — PROLACTIN: Prolactin: 7.4 ng/mL (ref 4.0–15.2)

## 2015-03-09 LAB — TESTOSTERONE,FREE AND TOTAL
TESTOSTERONE FREE: 10.7 pg/mL (ref 6.6–18.1)
Testosterone: 274 ng/dL — ABNORMAL LOW (ref 348–1197)

## 2015-03-11 ENCOUNTER — Telehealth: Payer: Self-pay | Admitting: Family Medicine

## 2015-03-11 DIAGNOSIS — R7989 Other specified abnormal findings of blood chemistry: Secondary | ICD-10-CM

## 2015-03-11 NOTE — Telephone Encounter (Signed)
Please call [facility]. We ordered Total and Free testosterone levels for blood drawn 03-07-2014, but we only received results of Free Testosterone. We did not receive results for Total testosterone.

## 2015-03-15 NOTE — Telephone Encounter (Signed)
Results are being faxed over.

## 2015-03-15 NOTE — Telephone Encounter (Signed)
Pt called because he hasn't heard back about his lab results. I spoke with Sharyn Lull and I advised pt that we would contact [facility] and call him back. Thanks TNP

## 2015-03-16 DIAGNOSIS — R7989 Other specified abnormal findings of blood chemistry: Secondary | ICD-10-CM | POA: Insufficient documentation

## 2015-03-16 MED ORDER — TESTOSTERONE 20.25 MG/ACT (1.62%) TD GEL: TRANSDERMAL | Status: DC

## 2015-03-16 NOTE — Telephone Encounter (Signed)
Pt advised as directed below.  Rx called into [facility].    Thanks,   -Mickel Baas

## 2015-03-16 NOTE — Telephone Encounter (Signed)
-----   Message from Birdie Sons, MD sent at 03/15/2015 10:05 PM EDT ----- Please advise patient that testosterone levels are still low. Need to start Androgel 1.62% 2 gel pumps once a day, 75 grams, rf x 1. Cannot be eprescribed, please call in. Need to recheck testosterone level in one month.

## 2015-03-18 ENCOUNTER — Encounter: Payer: Self-pay | Admitting: Family Medicine

## 2015-03-23 ENCOUNTER — Telehealth: Payer: Self-pay | Admitting: Emergency Medicine

## 2015-03-23 ENCOUNTER — Other Ambulatory Visit: Payer: Self-pay | Admitting: Family Medicine

## 2015-03-23 NOTE — Telephone Encounter (Signed)
Pt informed of message and he decided that he did not want to take anything prescription for his testosterone that he is going to try an build it up naturally first.

## 2015-03-23 NOTE — Telephone Encounter (Signed)
-----   Message from Birdie Sons, MD sent at 03/18/2015 10:19 PM EDT ----- Regarding: Androgel Insurance does not cover Androgel. Please discontinue in EMR, and call in rx for Androderm 2mg  patch. One patch Q24 hours. #30, rf x 1. Thanks.

## 2015-04-22 ENCOUNTER — Encounter: Payer: Self-pay | Admitting: Family Medicine

## 2015-04-22 ENCOUNTER — Ambulatory Visit (INDEPENDENT_AMBULATORY_CARE_PROVIDER_SITE_OTHER): Payer: Medicare Other | Admitting: Family Medicine

## 2015-04-22 VITALS — BP 116/64 | HR 80 | Temp 97.6°F | Resp 16 | Wt 199.0 lb

## 2015-04-22 DIAGNOSIS — R361 Hematospermia: Secondary | ICD-10-CM | POA: Diagnosis not present

## 2015-04-22 DIAGNOSIS — R7989 Other specified abnormal findings of blood chemistry: Secondary | ICD-10-CM

## 2015-04-22 DIAGNOSIS — E291 Testicular hypofunction: Secondary | ICD-10-CM | POA: Diagnosis not present

## 2015-04-22 LAB — POCT URINALYSIS DIPSTICK
Bilirubin, UA: NEGATIVE
Glucose, UA: NEGATIVE
KETONES UA: NEGATIVE
Leukocytes, UA: NEGATIVE
Nitrite, UA: NEGATIVE
PROTEIN UA: NEGATIVE
Spec Grav, UA: 1.01
Urobilinogen, UA: 0.2
pH, UA: 6.5

## 2015-04-22 MED ORDER — TESTOSTERONE 2 MG/24HR TD PT24: 1.0000 | MEDICATED_PATCH | Freq: Every day | TRANSDERMAL | Status: DC

## 2015-04-22 MED ORDER — CIPROFLOXACIN HCL 500 MG PO TABS: 500.0000 mg | ORAL_TABLET | Freq: Two times a day (BID) | ORAL | Status: AC

## 2015-04-22 NOTE — Progress Notes (Signed)
Patient: Chad Thompson Male    DOB: 1941/02/03   74 y.o.   MRN: [id number] Visit Date: 04/22/2015  Today's Provider: Lelon Huh, MD   No chief complaint on file.  Subjective:    HPI Blood in semen Patient reports 2 episodes of blood in semen over the last last 2 weeks. Has had no pain, no burning with urination.     Allergies  Allergen Reactions  . Ambien  [Zolpidem Tartrate]     sleep walking  . Penicillins   . Temazepam     extreme mood changes   Previous Medications   CETIRIZINE (ZYRTEC) 10 MG TABLET    Take 1 tablet by mouth daily.   COLCHICINE 0.6 MG TABLET    Take 1 tablet by mouth 3 (three) times daily as needed.   FLUOCINONIDE CREAM (LIDEX) 0.05 %    Apply 1 application topically 2 (two) times daily as needed.   MELATONIN (MELATONIN TR) 10 MG TBCR    Take 10 mg by mouth at bedtime as needed.   MELOXICAM (MOBIC) 15 MG TABLET    Take 1 tablet by mouth daily.   MONTELUKAST (SINGULAIR) 10 MG TABLET    Take 1 tablet by mouth daily.   MULTIPLE VITAMIN PO    Take 1 tablet by mouth daily.   NORTRIPTYLINE (PAMELOR) 10 MG CAPSULE    1-2 tablets at bedtime, as needed for insomnia   OMEPRAZOLE (PRILOSEC) 20 MG CAPSULE    Take 1 capsule by mouth daily.   PROBENECID (BENEMID) 500 MG TABLET    Take 1 tablet by mouth daily.   TERAZOSIN (HYTRIN) 1 MG CAPSULE    Take 1 capsule by mouth at bedtime.   TESTOSTERONE (ANDROGEL PUMP) 20.25 MG/ACT (1.62%) GEL    2 Gel pumps once a day.   VALSARTAN-HYDROCHLOROTHIAZIDE (DIOVAN-HCT) 160-12.5 MG PER TABLET    TAKE 1/2 TABLET BY MOUTH EVERY DAY    Review of Systems  Constitutional: Positive for fatigue.  Respiratory: Negative for chest tightness and shortness of breath.   Cardiovascular: Negative for chest pain, palpitations and leg swelling.  Genitourinary: Negative for urgency and frequency.  Neurological: Negative for dizziness, light-headedness and headaches.    History  Substance Use Topics  . Smoking status: Former Smoker  -- 1.50 packs/day for 30 years    Types: Cigarettes    Quit date: 09/25/1983  . Smokeless tobacco: Former Systems developer    Quit date: 09/25/1983  . Alcohol Use: 2.4 oz/week    0 Standard drinks or equivalent, 4 Glasses of wine per week   Objective:   BP 116/64 mmHg  Pulse 80  Temp(Src) 97.6 F (36.4 C) (Oral)  Resp 16  Wt 199 lb (90.266 kg)  SpO2 97%  Physical Exam  General appearance: alert, well developed, well nourished, cooperative and in no distress Head: Normocephalic, without obvious abnormality, atraumatic Lungs: Respirations even and unlabored Extremities: No gross deformities Skin: Skin color, texture, turgor normal. No rashes seen  Psych: Appropriate mood and affect. Neurologic: Mental status: Alert, oriented to person, place, and time, thought content appropriate.     Assessment & Plan:     1. Hematospermia Cover for prostatitis. He is to call if any additional episodes and will consider referral to urology.  - ciprofloxacin (CIPRO) 500 MG tablet; Take 1 tablet (500 mg total) by mouth 2 (two) times daily.  Dispense: 20 tablet; Refill: 0 - POCT urinalysis dipstick  2. Low testosterone  - Testosterone (ANDRODERM)  2 MG/24HR PT24; Place 1 patch onto the skin daily.  Dispense: 30 patch; Refill: 3       Lelon Huh, MD  [facility]

## 2015-05-02 ENCOUNTER — Telehealth: Payer: Self-pay | Admitting: Family Medicine

## 2015-05-02 NOTE — Telephone Encounter (Signed)
Pt called with telephone number for auth.  [phone].

## 2015-05-02 NOTE — Telephone Encounter (Signed)
Chad Thompson wanted to make sure the fax he sent came in because pt needs a prior authorization on a medication and he wanted to make sure we got the info we needed. Thanks TNP

## 2015-05-04 NOTE — Telephone Encounter (Signed)
Called  Mitzi Hansen at [employer] and her states the Prior authorization was for [employer] 2mg . Ulis Rias, have you received a fax for a prior auth. On this medication?

## 2015-05-13 ENCOUNTER — Telehealth: Payer: Self-pay | Admitting: Family Medicine

## 2015-05-13 NOTE — Telephone Encounter (Signed)
Pt states he stated taking the Rx for Testosterone (ANDRODERM) 2 MG/24HR PT24 2 days ago and started having a rash on the 2nd day.  Pt had swelling and red bumps around his stomach.  Pt states he has stopped taking the Rx and has been taking benadryl.  It is better and almost gone now.  Pt states he will see Dr Caryn Section in a week or so to discuss another Rx.  CB#[phone]/MW

## 2015-05-20 ENCOUNTER — Ambulatory Visit: Payer: Medicare Other | Admitting: Family Medicine

## 2015-05-24 ENCOUNTER — Ambulatory Visit (INDEPENDENT_AMBULATORY_CARE_PROVIDER_SITE_OTHER): Payer: Medicare Other | Admitting: Family Medicine

## 2015-05-24 ENCOUNTER — Encounter: Payer: Self-pay | Admitting: Family Medicine

## 2015-05-24 VITALS — BP 120/66 | HR 97 | Temp 98.4°F | Resp 20 | Wt 199.0 lb

## 2015-05-24 DIAGNOSIS — E291 Testicular hypofunction: Secondary | ICD-10-CM

## 2015-05-24 DIAGNOSIS — J309 Allergic rhinitis, unspecified: Secondary | ICD-10-CM

## 2015-05-24 DIAGNOSIS — R361 Hematospermia: Secondary | ICD-10-CM | POA: Diagnosis not present

## 2015-05-24 DIAGNOSIS — R7989 Other specified abnormal findings of blood chemistry: Secondary | ICD-10-CM

## 2015-05-24 NOTE — Progress Notes (Signed)
Patient: Chad Thompson Male    DOB: 02/19/1941   74 y.o.   MRN: [id number] Visit Date: 05/24/2015  Today's Provider: Lelon Huh, MD   Chief Complaint  Patient presents with  . Hypogonadism    follow up of low testosterone   Subjective:    HPI Low Testosterone Follow up: Last office visit was 04/22/2015. Patient was started on Androderm. Patient called on 05/13/2015 informing us that he had an allergic reaction to the Androderm (red bumpy rash and swelling on the abdomen) after applying patch the second day.. Patient stopped taking the Androderm and reaction resolved after taking benadryl. Patient is here to discuss other treatment options.  He stopped Androgel and rash has not returned.   Hematospermia follow up Patient was seen for this on 7/29 and was prescribed cipro to cover for possible prostate infection. He has had no episodes since then.     Allergies  Allergen Reactions  . Ambien  [Zolpidem Tartrate]     sleep walking  . Penicillins   . Temazepam     extreme mood changes   Previous Medications   CETIRIZINE (ZYRTEC) 10 MG TABLET    Take 1 tablet by mouth daily.   COLCHICINE 0.6 MG TABLET    Take 1 tablet by mouth 3 (three) times daily as needed.   FLUOCINONIDE CREAM (LIDEX) 0.05 %    Apply 1 application topically 2 (two) times daily as needed.   MELATONIN (MELATONIN TR) 10 MG TBCR    Take 10 mg by mouth at bedtime as needed.   MELOXICAM (MOBIC) 15 MG TABLET    Take 1 tablet by mouth daily.   MONTELUKAST (SINGULAIR) 10 MG TABLET    Take 1 tablet by mouth daily.   MULTIPLE VITAMIN PO    Take 1 tablet by mouth daily.   NORTRIPTYLINE (PAMELOR) 10 MG CAPSULE    1-2 tablets at bedtime, as needed for insomnia   OMEPRAZOLE (PRILOSEC) 20 MG CAPSULE    Take 1 capsule by mouth daily.   PROBENECID (BENEMID) 500 MG TABLET    Take 1 tablet by mouth daily.   TERAZOSIN (HYTRIN) 1 MG CAPSULE    Take 1 capsule by mouth at bedtime.   VALSARTAN-HYDROCHLOROTHIAZIDE (DIOVAN-HCT)  160-12.5 MG PER TABLET    TAKE 1/2 TABLET BY MOUTH EVERY DAY    Review of Systems  Constitutional: Negative for fever, chills and appetite change.  Respiratory: Negative for chest tightness, shortness of breath and wheezing.   Cardiovascular: Negative for chest pain and palpitations.  Gastrointestinal: Negative for nausea, vomiting and abdominal pain.    Social History  Substance Use Topics  . Smoking status: Former Smoker -- 1.50 packs/day for 30 years    Types: Cigarettes    Quit date: 09/25/1983  . Smokeless tobacco: Former Systems developer    Quit date: 09/25/1983  . Alcohol Use: 2.4 oz/week    0 Standard drinks or equivalent, 4 Glasses of wine per week     Comment: 3 glasses of wine every week   Objective:   BP 120/66 mmHg  Pulse 97  Temp(Src) 98.4 F (36.9 C) (Oral)  Resp 20  Wt 199 lb (90.266 kg)  SpO2 93%  Physical Exam  General appearance: alert, well developed, well nourished, cooperative and in no distress Head: Normocephalic, without obvious abnormality, atraumatic Lungs: Respirations even and unlabored Extremities: No gross deformities Skin: Skin color, texture, turgor normal. No rashes seen  Psych: Appropriate mood and affect. Neurologic:  Mental status: Alert, oriented to person, place, and time, thought content appropriate.     Assessment & Plan:     1. Low testosterone Did not tolerate testosterone patch due to skin reaction. All other topical testosterone products are not on his formulary. Discussed trying injectable testosterone. He states he has been exercising more, eating healthier and taking some vitamins, which seem to have helped his energy level. He states he will think about injections and call back if he decides to try it.   2. Allergic rhinitis, unspecified allergic rhinitis type He states that combination of Singular and OTC cetirizine is working well, but he thinks it may be contributing to fatigue.Advised that he could try taking them at night instead  of the morning.   3. Hematospermia No episodes since taking cipro for prostate infection. Call if any more episodes.        Lelon Huh, MD  [facility]

## 2015-06-04 ENCOUNTER — Other Ambulatory Visit: Payer: Self-pay | Admitting: Family Medicine

## 2015-08-24 ENCOUNTER — Other Ambulatory Visit: Payer: Self-pay | Admitting: Family Medicine

## 2015-08-27 ENCOUNTER — Other Ambulatory Visit: Payer: Self-pay | Admitting: Family Medicine

## 2015-11-08 DIAGNOSIS — J069 Acute upper respiratory infection, unspecified: Secondary | ICD-10-CM | POA: Diagnosis not present

## 2015-12-01 ENCOUNTER — Encounter: Payer: Self-pay | Admitting: Family Medicine

## 2015-12-01 ENCOUNTER — Ambulatory Visit (INDEPENDENT_AMBULATORY_CARE_PROVIDER_SITE_OTHER): Payer: Medicare Other | Admitting: Family Medicine

## 2015-12-01 VITALS — BP 110/64 | HR 78 | Temp 97.6°F | Resp 18 | Ht 68.5 in | Wt 198.0 lb

## 2015-12-01 DIAGNOSIS — M6530 Trigger finger, unspecified finger: Secondary | ICD-10-CM | POA: Diagnosis not present

## 2015-12-01 DIAGNOSIS — I10 Essential (primary) hypertension: Secondary | ICD-10-CM | POA: Diagnosis not present

## 2015-12-01 DIAGNOSIS — Z0000 Encounter for general adult medical examination without abnormal findings: Secondary | ICD-10-CM | POA: Diagnosis not present

## 2015-12-01 DIAGNOSIS — G4700 Insomnia, unspecified: Secondary | ICD-10-CM

## 2015-12-01 DIAGNOSIS — R05 Cough: Secondary | ICD-10-CM | POA: Insufficient documentation

## 2015-12-01 DIAGNOSIS — E785 Hyperlipidemia, unspecified: Secondary | ICD-10-CM | POA: Diagnosis not present

## 2015-12-01 DIAGNOSIS — R059 Cough, unspecified: Secondary | ICD-10-CM | POA: Insufficient documentation

## 2015-12-01 NOTE — Progress Notes (Signed)
Patient: Chad Thompson, Male    DOB: October 01, 1940, 75 y.o.   MRN: [id number] Visit Date: 12/01/2015  Today's Provider: Lelon Huh, MD   Chief Complaint  Patient presents with  . Medicare Wellness  . Hypertension  . Insomnia   Subjective:    Annual wellness visit Chad Thompson is a 75 y.o. male. He feels fairly well. He reports exercising yes/wts, bike. He reports he is sleeping well.  -----------------------------------------------------------  Follow-up for insomnia from 03/08/2015; switched from amitriptyline to nortriptyline. He is sleeping well, but feels very groggy in the mornings.    Follow-up for low testosterone from 05/24/2015; not currently taking any medications due to insurance coverage.   Cough on and off since Christmas. Cough was productive at first, but no longer, and is improving.   Right middle finger pain, swelling, and joint stiffness.    Hypertension, follow-up:  BP Readings from Last 3 Encounters:  12/01/15 110/64  05/24/15 120/66  04/22/15 116/64    He was last seen for hypertension 8 months ago.  BP at that visit was 112/70. Management since that visit includes; no changes.He reports good compliance with treatment. He is not having side effects. none  He is exercising. He is adherent to low salt diet.   Outside blood pressures are normal low. He is experiencing none.  Patient denies none.   Cardiovascular risk factors include none.  Use of agents associated with hypertension: none.   ----------------------------------------------------------------------     Review of Systems  Constitutional: Negative.   HENT: Positive for congestion.   Eyes: Negative.   Respiratory: Positive for cough.        Cough since Christmas 2016, mostly dry  Gastrointestinal: Negative.   Endocrine: Negative.   Genitourinary: Negative.   Musculoskeletal: Positive for arthralgias.  Allergic/Immunologic: Negative.   Neurological: Negative.     Hematological: Negative.   Psychiatric/Behavioral: Negative.     Social History   Social History  . Marital Status: Married    Spouse Name: N/A  . Number of Children: 2  . Years of Education: 10th grade   Occupational History  . Retired (semi)     Still owns business   Social History Main Topics  . Smoking status: Former Smoker -- 1.50 packs/day for 30 years    Types: Cigarettes    Quit date: 09/25/1983  . Smokeless tobacco: Former Systems developer    Quit date: 09/25/1983  . Alcohol Use: 2.4 oz/week    0 Standard drinks or equivalent, 4 Glasses of wine per week     Comment: 3 glasses of wine every week  . Drug Use: No  . Sexual Activity: Not on file   Other Topics Concern  . Not on file   Social History Narrative    Past Medical History  Diagnosis Date  . Basal cell carcinoma   . Low back pain   . Pleurisy without effusion or active tuberculosis 11/29/2009     Patient Active Problem List   Diagnosis Date Noted  . 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 Abdominal wall mass 02/16/2015  . Abnormal prostate exam 02/16/2015  . Allergic rhinitis 02/16/2015  . Basal cell carcinoma 02/16/2015  . BPH (benign prostatic hyperplasia) 02/16/2015  . Erectile dysfunction 02/16/2015  . Fatigue 02/16/2015  . Finger pain 02/16/2015  . GERD (gastroesophageal reflux disease) 02/16/2015  . Hyperlipidemia 02/16/2015  . Low back pain 02/16/2015  . Memory change 02/16/2015  . Strain of neck muscle 02/16/2015  . 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 Benign paroxysmal positional nystagmus 09/11/2012  . Family history of malignant neoplasm of prostate 09/11/2012  . Psoriasis 09/11/2012  . Excessive urination at night 06/08/2012  . Insomnia 01/23/2010  . Neck sprain and strain 11/06/2009  . Gout 11/01/2009  . Hypertension, essential 12/20/2008    Past Surgical History  Procedure Laterality Date  . Appendectomy      His family history includes Hypertension in his mother; Prostate cancer  in his brother.    Previous Medications   CETIRIZINE (ZYRTEC) 10 MG TABLET    Take 1 tablet by mouth daily.   COLCHICINE 0.6 MG TABLET    Take 1 tablet by mouth 3 (three) times daily as needed.   FLUOCINONIDE CREAM (LIDEX) 0.05 %    Apply 1 application topically 2 (two) times daily as needed.   MELATONIN (MELATONIN TR) 3 MG TBCR    Take 3 mg by mouth at bedtime as needed.   MELOXICAM (MOBIC) 15 MG TABLET    Take 1 tablet by mouth daily.   MONTELUKAST (SINGULAIR) 10 MG TABLET    TAKE 1 TABLET BY MOUTH DAILY   MULTIPLE VITAMIN PO    Take 1 tablet by mouth daily.   NORTRIPTYLINE (PAMELOR) 10 MG CAPSULE    TAKE 1- 2 CAPSULES BY MOUTH AT BEDTIME AS NEEDED FOR INSOMNIA   OMEPRAZOLE (PRILOSEC) 20 MG CAPSULE    Take 1 capsule by mouth daily.   PROBENECID (BENEMID) 500 MG TABLET    Take 1 tablet by mouth daily.   TERAZOSIN (HYTRIN) 1 MG CAPSULE    Take 1 capsule by mouth at bedtime.    Patient Care Team: Birdie Sons, MD as PCP - General (Family Medicine) Abbie Sons, MD as Consulting Physician (Urology) Robert Bellow, MD as Consulting Physician (General Surgery) Devra Dopp, MD as Referring Physician (Dermatology)     Objective:   Vitals: BP 110/64 mmHg  Pulse 78  Temp(Src) 97.6 F (36.4 C) (Oral)  Resp 18  Ht 5' 8.5" (1.74 m)  Wt 198 lb (89.812 kg)  BMI 29.66 kg/m2  SpO2 98%  Physical Exam   General Appearance:    Alert, cooperative, no distress, appears stated age  Head:    Normocephalic, without obvious abnormality, atraumatic  Eyes:    PERRL, conjunctiva/corneas clear, EOM's intact, fundi    benign, both eyes       Ears:    Normal TM's and external ear canals, both ears  Nose:   Nares normal, septum midline, mucosa normal, no drainage   or sinus tenderness  Throat:   Lips, mucosa, and tongue normal; teeth and gums normal  Neck:   Supple, symmetrical, trachea midline, no adenopathy;       thyroid:  No enlargement/tenderness/nodules; no carotid   bruit  or JVD  Back:     Symmetric, no curvature, ROM normal, no CVA tenderness  Lungs:     Clear to auscultation bilaterally, respirations unlabored  Chest wall:    No tenderness or deformity  Heart:    Regular rate and rhythm, S1 and S2 normal, no murmur, rub   or gallop  Abdomen:     Soft, non-tender, bowel sounds active all four quadrants,    no masses, no organomegaly  Genitalia:    deferred  Rectal:    deferred  Extremities:   Mild swelling of proximal IP of right third digit with trigerring, atraumatic, no cyanosis or edema  Pulses:   2+  and symmetric all extremities  Skin:   Skin color, texture, turgor normal, no rashes or lesions  Lymph nodes:   Cervical, supraclavicular, and axillary nodes normal  Neurologic:   CNII-XII intact. Normal strength, sensation and reflexes      throughout    Activities of Daily Living In your present state of health, do you have any difficulty performing the following activities: 12/01/2015  Hearing? N  Vision? N  Difficulty concentrating or making decisions? N  Walking or climbing stairs? N  Dressing or bathing? N  Doing errands, shopping? N    Fall Risk Assessment Fall Risk  12/01/2015  Falls in the past year? No     Depression Screen PHQ 2/9 Scores 12/01/2015  PHQ - 2 Score 0    Cognitive Testing - 6-CIT  Correct? Score   What year is it? yes 0 0 or 4  What month is it? yes 0 0 or 3  Memorize:    Pia Mau,  [birth year],  [place],      What time is it? (within 1 hour) yes 0 0 or 3  Count backwards from 20 yes 0 0, 2, or 4  Name the months of the year yes 0 0, 2, or 4  Repeat name & address above yes 4 0, 2, 4, 6, 8, or 10       TOTAL SCORE  4/28   Interpretation:  Normal  Normal (0-7) Abnormal (8-28)    Audit-C Alcohol Use Screening  Question Answer Points  How often do you have alcoholic drink? 2-3 times weekly 3  On days you do drink alcohol, how many drinks do you typically consume? 1-2 0  How oftey will you drink 6 or more  in a total? never 0  Total Score:  3   A score of 3 or more in women, and 4 or more in men indicates increased risk for alcohol abuse, EXCEPT if all of the points are from question 1.     Assessment & Plan:     Annual Wellness Visit  Reviewed patient's Family Medical History Reviewed and updated list of patient's medical providers Assessment of cognitive impairment was done Assessed patient's functional ability Established a written schedule for health screening [place] Completed and Reviewed  Exercise Activities and Dietary recommendations Goals    None      Immunization History  Administered Date(s) Administered  . Pneumococcal Conjugate-13 12/21/2013  . Pneumococcal Polysaccharide-23 08/12/2003, 11/01/2009  . Td 07/17/1999  . Tdap 07/31/2011    Health Maintenance  Topic Date Due  . ZOSTAVAX  02/27/2001  . COLONOSCOPY  12/09/2014  . INFLUENZA VACCINE  04/25/2015  . TETANUS/TDAP  07/30/2021  . PNA vac Low Risk Adult  Completed      Discussed health benefits of physical activity, and encouraged him to engage in regular exercise appropriate for his age and condition.    -------------------------------------------------------------------------------- 1. Medicare annual wellness visit, subsequent Generally doing well. He declined Shingles vaccine. He is up to date an screening labs. Counseled that he is due for colon cancer screening. He does not want colonoscopy. Recommended Cologuard. He is going to think about it and call us if he decides to have it done.   2. Trigger finger He would this treated. Refer to orthopedics.   3. Hyperlipidemia Diet controlled.   4. Hypertension, essential Has been very well controlled only take 1/2 of valsartan-hctz. Will put on hold. For now.  - EKG  12-Lead  5. Insomnia Having some morning somnolence on nortriptyline. He is going to try cutting this out and increase melatonin to 5-10mg  hs.   6.  Cough Persistent since having viral URI in December, but steadily improving. Advised that if it does not continue to improve or if not completely resolved in a month he should call for order for chest XR.

## 2015-12-01 NOTE — Patient Instructions (Signed)
Recommend Cologuard test to screen for colon polyps. Please call the office for order at your convenience  You are due for a Shingles Booster. I suggest that you check with your Medicare Part D plan to see if it is covered.

## 2015-12-14 DIAGNOSIS — L719 Rosacea, unspecified: Secondary | ICD-10-CM | POA: Diagnosis not present

## 2015-12-14 DIAGNOSIS — L570 Actinic keratosis: Secondary | ICD-10-CM | POA: Diagnosis not present

## 2015-12-14 DIAGNOSIS — L400 Psoriasis vulgaris: Secondary | ICD-10-CM | POA: Diagnosis not present

## 2015-12-14 DIAGNOSIS — L814 Other melanin hyperpigmentation: Secondary | ICD-10-CM | POA: Diagnosis not present

## 2015-12-14 DIAGNOSIS — Z23 Encounter for immunization: Secondary | ICD-10-CM | POA: Diagnosis not present

## 2015-12-14 DIAGNOSIS — Z85828 Personal history of other malignant neoplasm of skin: Secondary | ICD-10-CM | POA: Diagnosis not present

## 2015-12-14 DIAGNOSIS — D1800 Hemangioma unspecified site: Secondary | ICD-10-CM | POA: Diagnosis not present

## 2015-12-14 DIAGNOSIS — L821 Other seborrheic keratosis: Secondary | ICD-10-CM | POA: Diagnosis not present

## 2015-12-23 DIAGNOSIS — M65331 Trigger finger, right middle finger: Secondary | ICD-10-CM | POA: Diagnosis not present

## 2016-03-22 ENCOUNTER — Encounter: Payer: Self-pay | Admitting: Family Medicine

## 2016-03-22 ENCOUNTER — Ambulatory Visit (INDEPENDENT_AMBULATORY_CARE_PROVIDER_SITE_OTHER): Payer: Medicare Other | Admitting: Family Medicine

## 2016-03-22 VITALS — BP 104/78 | HR 71 | Temp 98.0°F | Resp 16 | Ht 68.5 in | Wt 200.0 lb

## 2016-03-22 DIAGNOSIS — R233 Spontaneous ecchymoses: Secondary | ICD-10-CM

## 2016-03-22 DIAGNOSIS — R238 Other skin changes: Secondary | ICD-10-CM | POA: Diagnosis not present

## 2016-03-22 NOTE — Patient Instructions (Signed)
Discuss your bruising with your dermatologist. If your decide to get labs we will call you with the lab results.

## 2016-03-22 NOTE — Progress Notes (Signed)
Subjective:     Patient ID: Chad Thompson, male   DOB: 1941/03/07, 75 y.o.   MRN: [id number]  HPI  Chief Complaint  Patient presents with  . Bleeding/Bruising  States since he started clobetasol ointment for psoriasis on his forearms he has noticed easy bruising on his forearms. Denies unusual bleeding or use of aspirin. He has appointment pending with his dermatologist.   Review of Systems     Objective:   Physical Exam  Constitutional: He appears well-developed and well-nourished. No distress.  Skin:  Several maroon contusions on his forearms varying from 0.5 cm to 4 cm.. Non-tender without underlying induration.       Assessment:    1. Abnormal bruising: suspect due to age and medication related skin thinning. - CBC with Differential/Platelet - Comprehensive metabolic panel    Plan:    He will discuss with his dermatologist. He will obtain labs if dermatologist agrees or he is worried about a systemic problem

## 2016-03-24 ENCOUNTER — Other Ambulatory Visit: Payer: Self-pay | Admitting: Family Medicine

## 2016-05-12 ENCOUNTER — Other Ambulatory Visit: Payer: Self-pay | Admitting: Family Medicine

## 2016-05-16 ENCOUNTER — Other Ambulatory Visit: Payer: Self-pay | Admitting: Family Medicine

## 2016-05-17 DIAGNOSIS — R238 Other skin changes: Secondary | ICD-10-CM | POA: Diagnosis not present

## 2016-05-18 ENCOUNTER — Telehealth: Payer: Self-pay

## 2016-05-18 LAB — COMPREHENSIVE METABOLIC PANEL
A/G RATIO: 1.4 (ref 1.2–2.2)
ALK PHOS: 72 IU/L (ref 39–117)
ALT: 18 IU/L (ref 0–44)
AST: 23 IU/L (ref 0–40)
Albumin: 4.2 g/dL (ref 3.5–4.8)
BILIRUBIN TOTAL: 0.8 mg/dL (ref 0.0–1.2)
BUN/Creatinine Ratio: 12 (ref 10–24)
BUN: 15 mg/dL (ref 8–27)
CHLORIDE: 100 mmol/L (ref 96–106)
CO2: 26 mmol/L (ref 18–29)
Calcium: 9.7 mg/dL (ref 8.6–10.2)
Creatinine, Ser: 1.24 mg/dL (ref 0.76–1.27)
GFR calc Af Amer: 65 mL/min/{1.73_m2} (ref >59)
GFR calc non Af Amer: 57 mL/min/{1.73_m2} — ABNORMAL LOW (ref >59)
Globulin, Total: 3 g/dL (ref 1.5–4.5)
Glucose: 108 mg/dL — ABNORMAL HIGH (ref 65–99)
POTASSIUM: 4.6 mmol/L (ref 3.5–5.2)
Sodium: 139 mmol/L (ref 134–144)
Total Protein: 7.2 g/dL (ref 6.0–8.5)

## 2016-05-18 LAB — CBC WITH DIFFERENTIAL/PLATELET
BASOS: 0 %
Basophils Absolute: 0 10*3/uL (ref 0.0–0.2)
EOS (ABSOLUTE): 0.4 10*3/uL (ref 0.0–0.4)
Eos: 5 %
Hematocrit: 40.4 % (ref 37.5–51.0)
Hemoglobin: 14.3 g/dL (ref 12.6–17.7)
Immature Grans (Abs): 0 10*3/uL (ref 0.0–0.1)
Immature Granulocytes: 0 %
Lymphocytes Absolute: 2.3 10*3/uL (ref 0.7–3.1)
Lymphs: 30 %
MCH: 33.5 pg — AB (ref 26.6–33.0)
MCHC: 35.4 g/dL (ref 31.5–35.7)
MCV: 95 fL (ref 79–97)
MONOS ABS: 0.6 10*3/uL (ref 0.1–0.9)
Monocytes: 9 %
NEUTROS ABS: 4.2 10*3/uL (ref 1.4–7.0)
NEUTROS PCT: 56 %
PLATELETS: 200 10*3/uL (ref 150–379)
RBC: 4.27 x10E6/uL (ref 4.14–5.80)
RDW: 13.1 % (ref 12.3–15.4)
WBC: 7.6 10*3/uL (ref 3.4–10.8)

## 2016-05-18 NOTE — Telephone Encounter (Signed)
Patient has been advised. KW 

## 2016-05-18 NOTE — Telephone Encounter (Signed)
-----   Message from Carmon Ginsberg, [place] sent at 05/18/2016  7:36 AM EDT ----- Labs ok. No anemia or decreased platelet count. Liver tests normal.

## 2016-08-07 DIAGNOSIS — L700 Acne vulgaris: Secondary | ICD-10-CM | POA: Diagnosis not present

## 2016-08-12 ENCOUNTER — Other Ambulatory Visit: Payer: Self-pay | Admitting: Family Medicine

## 2016-09-12 ENCOUNTER — Telehealth: Payer: Self-pay | Admitting: Family Medicine

## 2016-09-12 NOTE — Telephone Encounter (Signed)
Called Pt to schedule AWV with [facility] - knb °

## 2016-10-03 DIAGNOSIS — J101 Influenza due to other identified influenza virus with other respiratory manifestations: Secondary | ICD-10-CM | POA: Diagnosis not present

## 2016-10-11 ENCOUNTER — Other Ambulatory Visit: Payer: Self-pay | Admitting: Family Medicine

## 2016-11-30 ENCOUNTER — Telehealth: Payer: Self-pay

## 2016-11-30 NOTE — Telephone Encounter (Signed)
Patient called and states he sent something over yesterday about his medication but I do not see anything in the chart. Patient wants to switch back to Rapaflo and just take the side effects because Terazosin is not working and he is getting up at night to urinate several times. Please let patient know/ thank you-aa

## 2016-12-01 MED ORDER — SILODOSIN 8 MG PO CAPS: 8.0000 mg | ORAL_CAPSULE | Freq: Every day | ORAL | 5 refills | Status: DC

## 2016-12-10 ENCOUNTER — Encounter: Payer: Self-pay | Admitting: Family Medicine

## 2016-12-10 ENCOUNTER — Ambulatory Visit (INDEPENDENT_AMBULATORY_CARE_PROVIDER_SITE_OTHER): Payer: Medicare Other | Admitting: Family Medicine

## 2016-12-10 VITALS — BP 122/80 | HR 68 | Temp 98.1°F | Resp 16 | Ht 68.0 in | Wt 203.0 lb

## 2016-12-10 DIAGNOSIS — N401 Enlarged prostate with lower urinary tract symptoms: Secondary | ICD-10-CM

## 2016-12-10 DIAGNOSIS — I10 Essential (primary) hypertension: Secondary | ICD-10-CM | POA: Diagnosis not present

## 2016-12-10 DIAGNOSIS — Z1211 Encounter for screening for malignant neoplasm of colon: Secondary | ICD-10-CM | POA: Diagnosis not present

## 2016-12-10 DIAGNOSIS — E785 Hyperlipidemia, unspecified: Secondary | ICD-10-CM | POA: Diagnosis not present

## 2016-12-10 DIAGNOSIS — K219 Gastro-esophageal reflux disease without esophagitis: Secondary | ICD-10-CM | POA: Diagnosis not present

## 2016-12-10 DIAGNOSIS — Z125 Encounter for screening for malignant neoplasm of prostate: Secondary | ICD-10-CM

## 2016-12-10 DIAGNOSIS — Z0000 Encounter for general adult medical examination without abnormal findings: Secondary | ICD-10-CM

## 2016-12-10 MED ORDER — RANITIDINE HCL 150 MG PO TABS: 150.0000 mg | ORAL_TABLET | Freq: Two times a day (BID) | ORAL | 5 refills | Status: DC

## 2016-12-10 NOTE — Progress Notes (Signed)
Patient: Chad Thompson, Male    DOB: 09-Aug-1941, 76 y.o.   MRN: [id number] Visit Date: 12/10/2016  Today's Provider: Lelon Huh, MD   Chief Complaint  Patient presents with  . Medicare Wellness  . Hypertension    follow up  . Hyperlipidemia    follow up  . Insomnia    follow up   Subjective:    Annual wellness visit Chad Thompson is a 76 y.o. male. He feels well. He reports exercising 3 times a week. He reports he is sleeping poorly.  -----------------------------------------------------------  Hypertension, follow-up:  BP Readings from Last 3 Encounters:  03/22/16 104/78  12/01/15 110/64  05/24/15 120/66    He was last seen for hypertension 1 years ago.  BP at that visit was 110/64. Management since that visit includes putting Valsartan- HCTZ on hold for now. He reports good compliance with treatment. Patient has started taking 1/2 tablet of Valsartan- HCTZ.  He is not having side effects.  He is exercising. He is adherent to low salt diet.   Outside blood pressures are normal per patient report. He is experiencing none.  Patient denies chest pain, chest pressure/discomfort, claudication, dyspnea, exertional chest pressure/discomfort, fatigue, irregular heart beat, lower extremity edema, near-syncope, orthopnea, palpitations, paroxysmal nocturnal dyspnea, syncope and tachypnea.   Cardiovascular risk factors include advanced age (older than 68 for men, 43 for women) and hypertension.  Use of agents associated with hypertension: none.     Weight trend: stable Wt Readings from Last 3 Encounters:  03/22/16 200 lb (90.7 kg)  12/01/15 198 lb (89.8 kg)  05/24/15 199 lb (90.3 kg)    Current diet: well balanced  ------------------------------------------------------------------------  Lipid/Cholesterol, Follow-up:   Last seen for this1 years ago.  Management changes since that visit include none; diet controlled. . Last Lipid Panel:    Component  Value Date/Time   CHOL 200 01/28/2015   TRIG 157 01/28/2015   HDL 59 01/28/2015   LDLCALC 110 01/28/2015    Risk factors for vascular disease include hypercholesterolemia and hypertension  He reports good compliance with treatment. He is not having side effects.  Current symptoms include none and have been stable. Weight trend: stable Prior visit with dietician: no Current diet: well balanced Current exercise: walking and weightlifting  Wt Readings from Last 3 Encounters:  03/22/16 200 lb (90.7 kg)  12/01/15 198 lb (89.8 kg)  05/24/15 199 lb (90.3 kg)    ------------------------------------------------------------------- Follow up Insomnia:  Patient was last seen for this problem 1 year ago. Changes made include increasing Melatonin to 5-10mg  at night.   BPH Recently changed from terazosin back to Rapaflo which is working much better. Is much easier to begin voiding and does not have to get up as many times at night to urinate.   Also complains that about twice a year when he is eating chicken, the meat will get stuck in chest when swallowing. It eventually passes. He does have heartburn several times a week but usually improves when he turns and lays on his left side. He does not want to take strong acid reducer due to potential side effects.   Follow up gout He states he doesn't that he rarely has gout attacks, Continues on probenecid daily. He keeps colchidine with him wherever he goes, but hasn't had to take It for months or years.   He also reports he is going to [place] in the spring and that anyone over the age of 29  needs a letter from their doctor stating that it is safe to drive a car. .   Review of Systems  Constitutional: Negative for appetite change, chills, fatigue and fever.  HENT: Positive for congestion, sinus pressure and sneezing. Negative for ear pain, hearing loss, nosebleeds and trouble swallowing.   Eyes: Negative for pain and visual disturbance.    Respiratory: Negative for cough, chest tightness and shortness of breath.   Cardiovascular: Negative for chest pain, palpitations and leg swelling.  Gastrointestinal: Negative for abdominal pain, blood in stool, constipation, diarrhea, nausea and vomiting.  Endocrine: Negative for polydipsia, polyphagia and polyuria.  Genitourinary: Negative for dysuria and flank pain.  Musculoskeletal: Negative for arthralgias, back pain, joint swelling, myalgias and neck stiffness.  Skin: Negative for color change, rash and wound.  Allergic/Immunologic: Positive for environmental allergies.  Neurological: Negative for dizziness, tremors, seizures, speech difficulty, weakness, light-headedness and headaches.  Hematological: Bruises/bleeds easily.  Psychiatric/Behavioral: Negative for behavioral problems, confusion, decreased concentration, dysphoric mood and sleep disturbance. The patient is not nervous/anxious.   All other systems reviewed and are negative.   Social History   Social History  . Marital status: Married    Spouse name: N/A  . Number of children: 2  . Years of education: 10th grade   Occupational History  . Retired (semi)     Still owns business   Social History Main Topics  . Smoking status: Former Smoker    Packs/day: 1.50    Years: 30.00    Types: Cigarettes    Quit date: 09/25/1983  . Smokeless tobacco: Former Systems developer    Quit date: 09/25/1983  . Alcohol use 2.4 oz/week    4 Glasses of wine per week     Comment: 3 glasses of wine every week  . Drug use: No  . Sexual activity: Not on file   Other Topics Concern  . Not on file   Social History Narrative  . No narrative on file    Past Medical History:  Diagnosis Date  . Basal cell carcinoma   . Low back pain   . Pleurisy without effusion or active tuberculosis 11/29/2009     Patient Active Problem List   Diagnosis Date Noted  .   12/01/2015  . Cough 12/01/2015  . 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 Abdominal wall mass  02/16/2015  . Abnormal prostate exam 02/16/2015  . Allergic rhinitis 02/16/2015  . Basal cell carcinoma 02/16/2015  . BPH (benign prostatic hyperplasia) 02/16/2015  . Erectile dysfunction 02/16/2015  . Fatigue 02/16/2015  . Finger pain 02/16/2015  . GERD (gastroesophageal reflux disease) 02/16/2015  . Hyperlipidemia 02/16/2015  . Low back pain 02/16/2015  . Memory change 02/16/2015  . Strain of neck muscle 02/16/2015  . Squamous cell carcinoma 03/18/2014  . Benign paroxysmal positional nystagmus 09/11/2012  . Family history of malignant neoplasm of prostate 09/11/2012  . Psoriasis 09/11/2012  . Excessive urination at night 06/08/2012  . Insomnia 01/23/2010  . Neck sprain and strain 11/06/2009  . Gout 11/01/2009  . Hypertension, essential 12/20/2008    Past Surgical History:  Procedure Laterality Date  . APPENDECTOMY      His family history includes Hypertension in his mother; Prostate cancer in his brother.      Current Outpatient Prescriptions:  .  cetirizine (ZYRTEC) 10 MG tablet, Take 1 tablet by mouth daily., Disp: , Rfl:  .  clobetasol ointment (TEMOVATE) 0.05 %, APP AA BID FOR 2 WEEKS THEN FLARED, Disp: , Rfl: 0 .  colchicine 0.6 MG tablet, Take 1 tablet by mouth 3 (three) times daily as needed., Disp: , Rfl:  .  Melatonin (MELATONIN TR) 10 MG TBCR, Take 3 mg by mouth at bedtime as needed. , Disp: , Rfl:  .  meloxicam (MOBIC) 15 MG tablet, Take 1 tablet by mouth daily., Disp: , Rfl:  .  metroNIDAZOLE (METROGEL) 1 % gel, APP A THIN LAYER AA ONCE QD, Disp: , Rfl: 2 .  montelukast (SINGULAIR) 10 MG tablet, TAKE 1 TABLET BY MOUTH DAILY, Disp: 90 tablet, Rfl: 5 .  MULTIPLE VITAMIN PO, Take 1 tablet by mouth daily., Disp: , Rfl:  .  probenecid (BENEMID) 500 MG tablet, TAKE 1 TABLET BY MOUTH EVERY DAY, Disp: 90 tablet, Rfl: 4 .  silodosin (RAPAFLO) 8 MG CAPS capsule, Take 1 capsule (8 mg total) by mouth daily with breakfast., Disp: 30 capsule, Rfl: 5 .   valsartan-hydrochlorothiazide (DIOVAN-HCT) 160-12.5 MG tablet, TAKE 1/2 TABLET BY MOUTH EVERY DAY, Disp: 30 tablet, Rfl: 5 .  nortriptyline (PAMELOR) 10 MG capsule, TAKE 1- 2 CAPSULES BY MOUTH AT BEDTIME AS NEEDED FOR INSOMNIA (Patient not taking: Reported on 12/10/2016), Disp: 30 capsule, Rfl: 6 .  terazosin (HYTRIN) 1 MG capsule, TAKE 1 CAPSULE BY MOUTH EVERY NIGHT AT BEDTIME (Patient not taking: Reported on 12/10/2016), Disp: 90 capsule, Rfl: 3  Patient Care Team: Birdie Sons, MD as PCP - General (Family Medicine) Abbie Sons, MD as Consulting Physician (Urology) Robert Bellow, MD as Consulting Physician (General Surgery) Devra Dopp, MD as Referring Physician (Dermatology)     Objective:   Vitals: BP 122/80 (BP Location: Left Arm, Patient Position: Sitting, Cuff Size: Large)   Pulse 68   Temp 98.1 F (36.7 C) (Oral)   Resp 16   Ht 5\' 8"  (1.727 m)   Wt 203 lb (92.1 kg)   SpO2 96% Comment: room air  BMI 30.87 kg/m   Physical Exam   General Appearance:    Alert, cooperative, no distress A&O x 3.   Eyes:    PERRL, conjunctiva/corneas clear, EOM's intact       Lungs:     Clear to auscultation bilaterally, respirations unlabored  Heart:    Regular rate and rhythm  Neurologic:   Awake, alert, oriented x 3. No apparent focal neurological           defect.        Activities of Daily Living In your present state of health, do you have any difficulty performing the following activities: 12/10/2016  Hearing? Y  Vision? N  Difficulty concentrating or making decisions? N  Walking or climbing stairs? N  Dressing or bathing? N  Doing errands, shopping? N  Some recent data might be hidden    Fall Risk Assessment Fall Risk  12/10/2016 12/01/2015  Falls in the past year? No No     Depression Screen PHQ 2/9 Scores 12/10/2016 12/01/2015  PHQ - 2 Score 0 0  PHQ- 9 Score 2 -    Cognitive Testing - 6-CIT  Correct? Score   What year is it? yes 0 0 or 4  What month  is it? yes 0 0 or 3  Memorize:    Pia Mau,  42,  [place],      What time is it? (within 1 hour) yes 0 0 or 3  Count backwards from 20 yes 0 0, 2, or 4  Name the months of the year no 2 0, 2,  or 4  Repeat name & address above no 4 0, 2, 4, 6, 8, or 10       TOTAL SCORE  6/28   Interpretation:  Normal  Normal (0-7) Abnormal (8-28)   Audit-C Alcohol Use Screening  Question Answer Points  How often do you have alcoholic drink? 1 or more times weekly 3  On days you do drink alcohol, how many drinks do you typically consume? 1 or more 0  How oftey will you drink 6 or more in a total? never 0  Total Score:  3   A score of 3 or more in women, and 4 or more in men indicates increased risk for alcohol abuse, EXCEPT if all of the points are from question 1.   Current Exercise Habits: Home exercise routine, Type of exercise: strength training/weights;walking, Time (Minutes): 60, Frequency (Times/Week): 3, Weekly Exercise (Minutes/Week): 180, Intensity: Moderate Exercise limited by: None identified   Assessment & Plan:     Annual Wellness Visit  Reviewed patient's Family Medical History Reviewed and updated list of patient's medical providers Assessment of cognitive impairment was done Assessed patient's functional ability Established a written schedule for health screening [place] Completed and Reviewed  Exercise Activities and Dietary recommendations Goals    None      Immunization History  Administered Date(s) Administered  . Pneumococcal Conjugate-13 12/21/2013  . Pneumococcal Polysaccharide-23 08/12/2003, 11/01/2009  . Td 07/17/1999  . Tdap 07/31/2011    Health Maintenance  Topic Date Due  . COLONOSCOPY  12/09/2014  . INFLUENZA VACCINE  04/24/2016  . TETANUS/TDAP  07/30/2021  . PNA vac Low Risk Adult  Completed     Discussed health benefits of physical activity, and encouraged him to engage in regular exercise appropriate for  his age and condition.    ------------------------------------------------------------------------------------------------------------  1. Medicare annual wellness visit, subsequent Doing well. Written letter that it is safe for him to rent and drive motor vehicle when he goes to [place].   2. Hypertension, essential Well controlled.  Continue current medications.   - Lipid panel - Renal function panel  3. Benign prostatic hyperplasia with lower urinary tract symptoms, symptom details unspecified Much better since change to Rapaflo  4. Hyperlipidemia, unspecified hyperlipidemia type Check lipids  5. Colon cancer screening  - Cologuard  6. Prostate cancer screening  - PSA  7. Gastroesophageal reflux disease without esophagitis  - ranitidine (ZANTAC) 150 MG tablet; Take 1 tablet (150 mg total) by mouth 2 (two) times daily.  Dispense: 60 tablet; Refill: 5  The entirety of the information documented in the History of Present Illness, Review of Systems and Physical Exam were personally obtained by me. Portions of this information were initially documented by Meyer Cory, CMA and reviewed by me for thoroughness and accuracy.     Lelon Huh, MD  [facility]

## 2016-12-11 ENCOUNTER — Telehealth: Payer: Self-pay

## 2016-12-11 LAB — RENAL FUNCTION PANEL
ALBUMIN: 4.5 g/dL (ref 3.5–4.8)
BUN/Creatinine Ratio: 10 (ref 10–24)
BUN: 12 mg/dL (ref 8–27)
CALCIUM: 9.8 mg/dL (ref 8.6–10.2)
CHLORIDE: 97 mmol/L (ref 96–106)
CO2: 25 mmol/L (ref 18–29)
Creatinine, Ser: 1.16 mg/dL (ref 0.76–1.27)
GFR calc Af Amer: 71 mL/min/{1.73_m2} (ref >59)
GFR calc non Af Amer: 61 mL/min/{1.73_m2} (ref >59)
GLUCOSE: 106 mg/dL — AB (ref 65–99)
PHOSPHORUS: 2.8 mg/dL (ref 2.5–4.5)
Potassium: 4.9 mmol/L (ref 3.5–5.2)
SODIUM: 137 mmol/L (ref 134–144)

## 2016-12-11 LAB — LIPID PANEL
CHOL/HDL RATIO: 4.1 ratio (ref 0.0–5.0)
Cholesterol, Total: 185 mg/dL (ref 100–199)
HDL: 45 mg/dL (ref >39)
LDL Calculated: 104 mg/dL — ABNORMAL HIGH (ref 0–99)
Triglycerides: 182 mg/dL — ABNORMAL HIGH (ref 0–149)
VLDL Cholesterol Cal: 36 mg/dL (ref 5–40)

## 2016-12-11 LAB — PSA: PROSTATE SPECIFIC AG, SERUM: 1.1 ng/mL (ref 0.0–4.0)

## 2016-12-11 NOTE — Telephone Encounter (Signed)
-----   Message from Birdie Sons, MD sent at 12/11/2016  7:51 AM EDT ----- PSA, blood sugar, kidney functions, electrolytes and cholesterol are all normal. Check labs yearly.

## 2016-12-11 NOTE — Telephone Encounter (Signed)
Patient advised as directed below.  Thanks,  -Joseline 

## 2016-12-12 ENCOUNTER — Telehealth: Payer: Self-pay | Admitting: Family Medicine

## 2016-12-12 NOTE — Telephone Encounter (Signed)
Please advise 

## 2016-12-12 NOTE — Telephone Encounter (Signed)
Order for cologuard faxed to [facility] °

## 2016-12-12 NOTE — Telephone Encounter (Signed)
Pt stated that he is going to [place] and he needs a letter from Dr. Caryn Section stating that pt is mentally and physically able to operate a motor vehicle so that he can rent a car in [place]. Pt stated that this is required for a driver at his age in [place]. Please advise. Thanks TNP

## 2016-12-17 DIAGNOSIS — Z1212 Encounter for screening for malignant neoplasm of rectum: Secondary | ICD-10-CM | POA: Diagnosis not present

## 2016-12-17 DIAGNOSIS — Z1211 Encounter for screening for malignant neoplasm of colon: Secondary | ICD-10-CM | POA: Diagnosis not present

## 2016-12-17 LAB — COLOGUARD: COLOGUARD: NEGATIVE

## 2017-01-07 DIAGNOSIS — J309 Allergic rhinitis, unspecified: Secondary | ICD-10-CM | POA: Diagnosis not present

## 2017-01-07 DIAGNOSIS — J019 Acute sinusitis, unspecified: Secondary | ICD-10-CM | POA: Diagnosis not present

## 2017-01-09 DIAGNOSIS — H04123 Dry eye syndrome of bilateral lacrimal glands: Secondary | ICD-10-CM | POA: Diagnosis not present

## 2017-01-09 DIAGNOSIS — H40033 Anatomical narrow angle, bilateral: Secondary | ICD-10-CM | POA: Diagnosis not present

## 2017-02-08 ENCOUNTER — Other Ambulatory Visit: Payer: Self-pay | Admitting: Family Medicine

## 2017-02-08 MED ORDER — COLCHICINE 0.6 MG PO TABS: 0.6000 mg | ORAL_TABLET | Freq: Three times a day (TID) | ORAL | 5 refills | Status: AC | PRN

## 2017-02-08 NOTE — Telephone Encounter (Signed)
Pt needs refill   colcrys 0.6 mg  Pt is having gout flair up  [facility] in [place]  Thanks [facility]

## 2017-03-14 ENCOUNTER — Other Ambulatory Visit: Payer: Self-pay | Admitting: Family Medicine

## 2017-04-30 DIAGNOSIS — L578 Other skin changes due to chronic exposure to nonionizing radiation: Secondary | ICD-10-CM | POA: Diagnosis not present

## 2017-04-30 DIAGNOSIS — L719 Rosacea, unspecified: Secondary | ICD-10-CM | POA: Diagnosis not present

## 2017-04-30 DIAGNOSIS — Z872 Personal history of diseases of the skin and subcutaneous tissue: Secondary | ICD-10-CM | POA: Diagnosis not present

## 2017-04-30 DIAGNOSIS — L570 Actinic keratosis: Secondary | ICD-10-CM | POA: Diagnosis not present

## 2017-04-30 DIAGNOSIS — Z85828 Personal history of other malignant neoplasm of skin: Secondary | ICD-10-CM | POA: Diagnosis not present

## 2017-05-27 ENCOUNTER — Other Ambulatory Visit: Payer: Self-pay | Admitting: Family Medicine

## 2017-07-02 DIAGNOSIS — Z23 Encounter for immunization: Secondary | ICD-10-CM | POA: Diagnosis not present

## 2017-08-06 DIAGNOSIS — L719 Rosacea, unspecified: Secondary | ICD-10-CM | POA: Diagnosis not present

## 2017-08-06 DIAGNOSIS — L409 Psoriasis, unspecified: Secondary | ICD-10-CM | POA: Diagnosis not present

## 2017-08-06 DIAGNOSIS — L578 Other skin changes due to chronic exposure to nonionizing radiation: Secondary | ICD-10-CM | POA: Diagnosis not present

## 2017-08-06 DIAGNOSIS — Z79899 Other long term (current) drug therapy: Secondary | ICD-10-CM | POA: Diagnosis not present

## 2017-08-06 DIAGNOSIS — L570 Actinic keratosis: Secondary | ICD-10-CM | POA: Diagnosis not present

## 2017-08-06 DIAGNOSIS — Z85828 Personal history of other malignant neoplasm of skin: Secondary | ICD-10-CM | POA: Diagnosis not present

## 2017-09-18 ENCOUNTER — Other Ambulatory Visit: Payer: Self-pay | Admitting: Family Medicine

## 2017-09-18 DIAGNOSIS — K219 Gastro-esophageal reflux disease without esophagitis: Secondary | ICD-10-CM

## 2017-11-09 ENCOUNTER — Other Ambulatory Visit: Payer: Self-pay | Admitting: Family Medicine

## 2017-11-13 ENCOUNTER — Telehealth: Payer: Self-pay | Admitting: Family Medicine

## 2017-11-14 NOTE — Telephone Encounter (Signed)
He called back and declined [place] this year

## 2017-12-02 ENCOUNTER — Telehealth: Payer: Self-pay | Admitting: Family Medicine

## 2017-12-02 NOTE — Telephone Encounter (Signed)
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

## 2017-12-11 ENCOUNTER — Ambulatory Visit (INDEPENDENT_AMBULATORY_CARE_PROVIDER_SITE_OTHER): Payer: Medicare Other | Admitting: Family Medicine

## 2017-12-11 ENCOUNTER — Encounter: Payer: Self-pay | Admitting: Family Medicine

## 2017-12-11 VITALS — BP 132/68 | Temp 97.9°F | Resp 16 | Wt 194.0 lb

## 2017-12-11 DIAGNOSIS — I10 Essential (primary) hypertension: Secondary | ICD-10-CM | POA: Diagnosis not present

## 2017-12-11 DIAGNOSIS — E785 Hyperlipidemia, unspecified: Secondary | ICD-10-CM | POA: Diagnosis not present

## 2017-12-11 DIAGNOSIS — R5383 Other fatigue: Secondary | ICD-10-CM | POA: Diagnosis not present

## 2017-12-11 DIAGNOSIS — Z0001 Encounter for general adult medical examination with abnormal findings: Secondary | ICD-10-CM | POA: Diagnosis not present

## 2017-12-11 DIAGNOSIS — Z125 Encounter for screening for malignant neoplasm of prostate: Secondary | ICD-10-CM | POA: Diagnosis not present

## 2017-12-11 DIAGNOSIS — J329 Chronic sinusitis, unspecified: Secondary | ICD-10-CM | POA: Diagnosis not present

## 2017-12-11 DIAGNOSIS — Z0000 Encounter for general adult medical examination without abnormal findings: Secondary | ICD-10-CM

## 2017-12-11 DIAGNOSIS — Z23 Encounter for immunization: Secondary | ICD-10-CM

## 2017-12-11 MED ORDER — AZITHROMYCIN 250 MG PO TABS: ORAL_TABLET | ORAL | 0 refills | Status: AC

## 2017-12-11 NOTE — Progress Notes (Signed)
Patient: Chad Thompson, Male    DOB: Apr 26, 1941, 77 y.o.   MRN: [id number] Visit Date: 12/11/2017  Today's Provider: Lelon Huh, MD   Chief Complaint  Patient presents with  . Annual Wellness Exam  . Hypertension  . Gastroesophageal Reflux  . Benign Prostatic Hypertrophy  . Hyperlipidemia   Subjective:    Annual wellness visit Chad Thompson is a 77 y.o. male. He feels well. He reports exercising not regularly. He reports he is sleeping fairly well.    Hypertension, follow-up:  BP Readings from Last 3 Encounters:  12/11/17 132/68  12/10/16 122/80  03/22/16 104/78    He was last seen for hypertension 1 years ago.  BP at that visit was 122/80. Management since that visit includes; no changes.He reports good compliance with treatment. He is not having side effects.  He is not exercising. He is not adherent to low salt diet.   Outside blood pressures are checked daily. He is experiencing exertional chest pressure/discomfort, lower extremity edema and palpitations.  Patient denies chest pressure/discomfort, exertional chest pressure/discomfort and lower extremity edema.   Cardiovascular risk factors include dyslipidemia.    Lipid/Cholesterol, Follow-up:   Last seen for this 1 years ago.  Management since that visit includes; labs checked, no changes.  Last Lipid Panel:    Component Value Date/Time   CHOL 185 12/10/2016 0954   CHOL 200 01/28/2015   TRIG 182 (H) 12/10/2016 0954   TRIG 157 01/28/2015   HDL 45 12/10/2016 0954   CHOLHDL 4.1 12/10/2016 0954   LDLCALC 104 (H) 12/10/2016 0954    He reports good compliance with treatment. He is not having side effects.   Wt Readings from Last 3 Encounters:  12/11/17 194 lb (88 kg)  12/10/16 203 lb (92.1 kg)  03/22/16 200 lb (90.7 kg)    Benign prostatic hyperplasia with lower urinary tract symptoms, symptom details unspecified From 12/10/2016-no changes were made. Continues on rapaflo daily which he  feels is working well. Is having to get up once every night to urinated.   Gastroesophageal reflux disease without esophagitis From 12/10/2016-started ranitidine (ZANTAC) 150 MG tablet bid which is controlling symptoms.   Complains of 6 weeks sinus congestion and sometimes blowing blood from sinuses off and on. Using OTC fluticasone consistently.   States legs get tired easily and given out getting progressively worse over the last year. Limits physical activity such as dancing. No burning in calves or thighs.   Also complains of feeling cold all the time.   Review of Systems  Constitutional: Negative.   HENT: Negative.   Eyes: Negative.   Respiratory: Negative.   Cardiovascular: Negative.   Gastrointestinal: Negative.   Endocrine: Negative.   Genitourinary: Positive for frequency.  Musculoskeletal: Negative.   Skin: Negative.   Allergic/Immunologic: Negative.   Neurological: Negative.   Hematological: Negative.   Psychiatric/Behavioral: Negative.     Social History   Socioeconomic History  . Marital status: Married    Spouse name: Not on file  . Number of children: 2  . Years of education: 10th grade  . Highest education level: Not on file  Social Needs  . Financial resource strain: Not on file  . Food insecurity - worry: Not on file  . Food insecurity - inability: Not on file  . Transportation needs - medical: Not on file  . Transportation needs - non-medical: Not on file  Occupational History  . Occupation: Retired (semi)    Comment: Still  owns business  Tobacco Use  . Smoking status: Former Smoker    Packs/day: 1.50    Years: 30.00    Pack years: 45.00    Types: Cigarettes    Last attempt to quit: 09/25/1983    Years since quitting: 34.2  . Smokeless tobacco: Former Systems developer    Quit date: 09/25/1983  . Tobacco comment: started smoking at age 32  Substance and Sexual Activity  . Alcohol use: Yes    Alcohol/week: 2.4 oz    Types: 4 Glasses of wine per week     Comment: 3 glasses of wine every week  . Drug use: No  . Sexual activity: Not on file  Other Topics Concern  . Not on file  Social History Narrative  . Not on file    Past Medical History:  Diagnosis Date  . Basal cell carcinoma   . Low back pain   . Pleurisy without effusion or active tuberculosis 11/29/2009     Patient Active Problem List   Diagnosis Date Noted  .   12/01/2015  . Cough 12/01/2015  . 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 Abdominal wall mass 02/16/2015  . Abnormal prostate exam 02/16/2015  . Allergic rhinitis 02/16/2015  . Basal cell carcinoma 02/16/2015  . BPH (benign prostatic hyperplasia) 02/16/2015  . Erectile dysfunction 02/16/2015  . Fatigue 02/16/2015  . Finger pain 02/16/2015  . GERD (gastroesophageal reflux disease) 02/16/2015  . Hyperlipidemia 02/16/2015  . Low back pain 02/16/2015  . Memory change 02/16/2015  . Strain of neck muscle 02/16/2015  . Squamous cell carcinoma 03/18/2014  . Benign paroxysmal positional nystagmus 09/11/2012  . Family history of malignant neoplasm of prostate 09/11/2012  . Psoriasis 09/11/2012  . Excessive urination at night 06/08/2012  . Insomnia 01/23/2010  . Gout 11/01/2009  . Hypertension, essential 12/20/2008    Past Surgical History:  Procedure Laterality Date  . APPENDECTOMY      His family history includes Hypertension in his mother; Prostate cancer in his brother.      Current Outpatient Medications:  .  cetirizine (ZYRTEC) 10 MG tablet, Take 1 tablet by mouth daily., Disp: , Rfl:  .  clobetasol ointment (TEMOVATE) 0.05 %, APP AA BID FOR 2 WEEKS THEN FLARED, Disp: , Rfl: 0 .  colchicine 0.6 MG tablet, Take 1 tablet (0.6 mg total) by mouth 3 (three) times daily as needed., Disp: 30 tablet, Rfl: 5 .  fluocinonide (LIDEX) 0.05 % external solution, Use once daily on scalp, Disp: , Rfl:  .  fluorouracil (EFUDEX) 5 % cream, Use once in the evening on precancer spots, Disp: , Rfl:  .  fluticasone (FLONASE) 50  MCG/ACT nasal spray, Place 2 sprays into both nostrils daily., Disp: , Rfl:  .  Melatonin (MELATONIN TR) 10 MG TBCR, Take 3 mg by mouth at bedtime as needed. , Disp: , Rfl:  .  meloxicam (MOBIC) 15 MG tablet, Take 1 tablet by mouth daily., Disp: , Rfl:  .  metroNIDAZOLE (METROGEL) 1 % gel, APP A THIN LAYER AA ONCE QD, Disp: , Rfl: 2 .  montelukast (SINGULAIR) 10 MG tablet, TAKE 1 TABLET BY MOUTH DAILY, Disp: 90 tablet, Rfl: 5 .  MULTIPLE VITAMIN PO, Take 1 tablet by mouth daily., Disp: , Rfl:  .  probenecid (BENEMID) 500 MG tablet, TAKE 1 TABLET BY MOUTH EVERY DAY, Disp: 90 tablet, Rfl: 4 .  ranitidine (ZANTAC) 150 MG tablet, TAKE 1 TABLET(150 MG) BY MOUTH TWICE DAILY, Disp: 60 tablet, Rfl: 11 .  RAPAFLO 8 MG CAPS capsule, TAKE 1 CAPSULE(8 MG) BY MOUTH DAILY WITH BREAKFAST, Disp: 30 capsule, Rfl: 11 .  valsartan-hydrochlorothiazide (DIOVAN-HCT) 160-12.5 MG tablet, TAKE 1/2 TABLET BY MOUTH EVERY DAY, Disp: 30 tablet, Rfl: 12  Patient Care Team: Birdie Sons, MD as PCP - General (Family Medicine) Bary Castilla, Forest Gleason, MD as Consulting Physician (General Surgery) Weston Settle, MD as Referring Physician (Dermatology)     Objective:   Vitals: BP 132/68 (BP Location: Left Arm, Patient Position: Sitting, Cuff Size: Normal)   Temp 97.9 F (36.6 C)   Resp 16   Wt 194 lb (88 kg) Comment: per patient  BMI 29.50 kg/m   Physical Exam   General Appearance:    Alert, cooperative, no distress  Eyes:    PERRL, conjunctiva/corneas clear, EOM's intact       Lungs:     Clear to auscultation bilaterally, respirations unlabored  Heart:    Regular rate and rhythm, II/VI systolic murmur  Neurologic:   Awake, alert, oriented x 3. No apparent focal neurological           defect.   Ext:  2+ pedal pulses    Activities of Daily Living In your present state of health, do you have any difficulty performing the following activities: 12/11/2017  Hearing? Y  Vision? N  Difficulty concentrating or making  decisions? N  Walking or climbing stairs? N  Dressing or bathing? N  Doing errands, shopping? N  Some recent data might be hidden    Fall Risk Assessment Fall Risk  12/11/2017 12/10/2016 12/01/2015  Falls in the past year? No No No     Depression Screen PHQ 2/9 Scores 12/11/2017 12/10/2016 12/01/2015  PHQ - 2 Score 0 0 0  PHQ- 9 Score - 2 -    Cognitive Testing - 6-CIT  Correct? Score   What year is it? yes 0 0 or 4  What month is it? yes 0 0 or 3  Memorize:    Pia Mau,  42,  [address]  [place],      What time is it? (within 1 hour) yes 0 0 or 3  Count backwards from 20 yes 0 0, 2, or 4  Name the months of the year yes 0 0, 2, or 4  Repeat name & address above yes 0 0, 2, 4, 6, 8, or 10       TOTAL SCORE  0/28   Interpretation:  Normal  Normal (0-7) Abnormal (8-28)       Assessment & Plan:     Annual Wellness Visit  Reviewed patient's Family Medical History Reviewed and updated list of patient's medical providers Assessment of cognitive impairment was done Assessed patient's functional ability Established a written schedule for health screening [facility] Completed and Reviewed  Exercise Activities and Dietary recommendations Goals    None      Immunization History  Administered Date(s) Administered  . Influenza-Unspecified 07/02/2017  . Pneumococcal Conjugate-13 12/21/2013  . Pneumococcal Polysaccharide-23 08/12/2003, 11/01/2009  . Td 07/17/1999  . Tdap 07/31/2011    Health Maintenance  Topic Date Due  . Samul Dada  07/30/2021  . INFLUENZA VACCINE  Completed  . PNA vac Low Risk Adult  Completed     Discussed health benefits of physical activity, and encouraged him to engage in regular exercise appropriate for his age and condition.   1. Medicare annual wellness visit, subsequent No new preventative health issues.   2. Hyperlipidemia, unspecified hyperlipidemia type  Diet controlled.  - Lipid panel - Comprehensive  metabolic panel  3. Hypertension, essential Well controlled.  Continue current medications.   - EKG 12-[place]  4. Sinusitis, unspecified chronicity, unspecified location  - azithromycin (ZITHROMAX) 250 MG tablet; 2 by mouth today, then 1 daily for 4 days  Dispense: 6 tablet; Refill: 0  5. Need for shingles vaccine Counseled regarding recommendations for Shingrix  6. Prostate cancer screening  - PSA  7. Other fatigue  - CBC - TSH    Lelon Huh, MD  [facility]

## 2017-12-11 NOTE — Patient Instructions (Addendum)
   The CDC recommends two doses of Shingrix (the shingles vaccine) separated by 2 to 6 months for adults age 77 years and older. I recommend checking with your insurance plan regarding coverage for this vaccine.   

## 2017-12-12 ENCOUNTER — Encounter: Payer: Self-pay | Admitting: Family Medicine

## 2017-12-12 LAB — COMPREHENSIVE METABOLIC PANEL
ALK PHOS: 74 IU/L (ref 39–117)
ALT: 19 IU/L (ref 0–44)
AST: 27 IU/L (ref 0–40)
Albumin/Globulin Ratio: 1.5 (ref 1.2–2.2)
Albumin: 4.4 g/dL (ref 3.5–4.8)
BUN / CREAT RATIO: 10 (ref 10–24)
BUN: 12 mg/dL (ref 8–27)
Bilirubin Total: 0.8 mg/dL (ref 0.0–1.2)
CALCIUM: 9.8 mg/dL (ref 8.6–10.2)
CO2: 21 mmol/L (ref 20–29)
CREATININE: 1.19 mg/dL (ref 0.76–1.27)
Chloride: 101 mmol/L (ref 96–106)
GFR calc Af Amer: 68 mL/min/{1.73_m2} (ref >59)
GFR, EST NON AFRICAN AMERICAN: 59 mL/min/{1.73_m2} — AB (ref >59)
GLOBULIN, TOTAL: 3 g/dL (ref 1.5–4.5)
Glucose: 98 mg/dL (ref 65–99)
Potassium: 4.3 mmol/L (ref 3.5–5.2)
Sodium: 138 mmol/L (ref 134–144)
TOTAL PROTEIN: 7.4 g/dL (ref 6.0–8.5)

## 2017-12-12 LAB — CBC
HEMATOCRIT: 40.8 % (ref 37.5–51.0)
Hemoglobin: 14.5 g/dL (ref 13.0–17.7)
MCH: 33.6 pg — AB (ref 26.6–33.0)
MCHC: 35.5 g/dL (ref 31.5–35.7)
MCV: 94 fL (ref 79–97)
Platelets: 204 10*3/uL (ref 150–379)
RBC: 4.32 x10E6/uL (ref 4.14–5.80)
RDW: 13.6 % (ref 12.3–15.4)
WBC: 8.8 10*3/uL (ref 3.4–10.8)

## 2017-12-12 LAB — LIPID PANEL
CHOLESTEROL TOTAL: 205 mg/dL — AB (ref 100–199)
Chol/HDL Ratio: 4.2 ratio (ref 0.0–5.0)
HDL: 49 mg/dL (ref >39)
LDL Calculated: 127 mg/dL — ABNORMAL HIGH (ref 0–99)
Triglycerides: 145 mg/dL (ref 0–149)
VLDL CHOLESTEROL CAL: 29 mg/dL (ref 5–40)

## 2017-12-12 LAB — TSH: TSH: 4.39 u[IU]/mL (ref 0.450–4.500)

## 2017-12-12 LAB — PSA: Prostate Specific Ag, Serum: 1.1 ng/mL (ref 0.0–4.0)

## 2017-12-24 ENCOUNTER — Other Ambulatory Visit: Payer: Self-pay | Admitting: Family Medicine

## 2017-12-24 DIAGNOSIS — K219 Gastro-esophageal reflux disease without esophagitis: Secondary | ICD-10-CM

## 2018-02-03 DIAGNOSIS — Z85828 Personal history of other malignant neoplasm of skin: Secondary | ICD-10-CM | POA: Diagnosis not present

## 2018-02-03 DIAGNOSIS — L570 Actinic keratosis: Secondary | ICD-10-CM | POA: Diagnosis not present

## 2018-02-03 DIAGNOSIS — L578 Other skin changes due to chronic exposure to nonionizing radiation: Secondary | ICD-10-CM | POA: Diagnosis not present

## 2018-02-03 DIAGNOSIS — L304 Erythema intertrigo: Secondary | ICD-10-CM | POA: Diagnosis not present

## 2018-02-03 DIAGNOSIS — L409 Psoriasis, unspecified: Secondary | ICD-10-CM | POA: Diagnosis not present

## 2018-03-06 DIAGNOSIS — H40033 Anatomical narrow angle, bilateral: Secondary | ICD-10-CM | POA: Diagnosis not present

## 2018-03-06 DIAGNOSIS — H2513 Age-related nuclear cataract, bilateral: Secondary | ICD-10-CM | POA: Diagnosis not present

## 2018-03-17 ENCOUNTER — Other Ambulatory Visit: Payer: Self-pay | Admitting: Family Medicine

## 2018-06-01 ENCOUNTER — Other Ambulatory Visit: Payer: Self-pay | Admitting: Family Medicine

## 2018-08-13 DIAGNOSIS — K1329 Other disturbances of oral epithelium, including tongue: Secondary | ICD-10-CM | POA: Diagnosis not present

## 2018-08-25 DIAGNOSIS — K1329 Other disturbances of oral epithelium, including tongue: Secondary | ICD-10-CM | POA: Diagnosis not present

## 2018-09-01 DIAGNOSIS — K1329 Other disturbances of oral epithelium, including tongue: Secondary | ICD-10-CM | POA: Diagnosis not present

## 2018-10-06 ENCOUNTER — Encounter: Payer: Self-pay | Admitting: Family Medicine

## 2018-10-06 DIAGNOSIS — K1321 Leukoplakia of oral mucosa, including tongue: Secondary | ICD-10-CM | POA: Insufficient documentation

## 2018-10-16 ENCOUNTER — Encounter: Payer: Self-pay | Admitting: Family Medicine

## 2018-10-16 ENCOUNTER — Ambulatory Visit (INDEPENDENT_AMBULATORY_CARE_PROVIDER_SITE_OTHER): Payer: Medicare PPO | Admitting: Family Medicine

## 2018-10-16 VITALS — BP 118/60 | HR 68 | Temp 98.4°F | Resp 16 | Wt 197.0 lb

## 2018-10-16 DIAGNOSIS — R351 Nocturia: Secondary | ICD-10-CM

## 2018-10-16 DIAGNOSIS — Z125 Encounter for screening for malignant neoplasm of prostate: Secondary | ICD-10-CM

## 2018-10-16 DIAGNOSIS — N401 Enlarged prostate with lower urinary tract symptoms: Secondary | ICD-10-CM

## 2018-10-16 DIAGNOSIS — I10 Essential (primary) hypertension: Secondary | ICD-10-CM

## 2018-10-16 DIAGNOSIS — Z23 Encounter for immunization: Secondary | ICD-10-CM | POA: Diagnosis not present

## 2018-10-16 DIAGNOSIS — E785 Hyperlipidemia, unspecified: Secondary | ICD-10-CM

## 2018-10-16 NOTE — Progress Notes (Signed)
Patient: Chad Thompson Male    DOB: Nov 05, 1940   78 y.o.   MRN: [id number] Visit Date: 10/16/2018  Today's Provider: Lelon Huh, MD   Chief Complaint  Patient presents with  . Hypertension  . Hyperlipidemia   Subjective:     HPI   He is doing well with no complaints today. He has moved to [place] but still working part time which is going well. Overall feels great.    Hypertension, follow-up:  BP Readings from Last 3 Encounters:  10/16/18 118/60  12/11/17 132/68  12/10/16 122/80    He was last seen for hypertension 10 months ago.  BP at that visit was 132/68. Management since that visit includes No ChangeHe reports excellent compliance with treatment. He is not having side effects.  He is exercising. He is adherent to low salt diet.   Outside blood pressures are normal at home. He is experiencing none.  Patient denies chest pain, fatigue, lower extremity edema, palpitations and syncope.   Cardiovascular risk factors include advanced age (older than 2 for men, 39 for women), dyslipidemia, hypertension and male gender.  Use of agents associated with hypertension: none.   ------------------------------------------------------------------------    Lipid/Cholesterol, Follow-up:   Last seen for this 10 months ago.  Management since that visit includes No changes.  Last Lipid Panel:    Component Value Date/Time   CHOL 205 (H) 12/11/2017 1022   CHOL 200 01/28/2015   TRIG 145 12/11/2017 1022   TRIG 157 01/28/2015   HDL 49 12/11/2017 1022   CHOLHDL 4.2 12/11/2017 1022   LDLCALC 127 (H) 12/11/2017 1022    He reports excellent compliance with treatment. He is not having side effects.   Wt Readings from Last 3 Encounters:  10/16/18 197 lb (89.4 kg)  12/11/17 194 lb (88 kg)  12/10/16 203 lb (92.1 kg)    ------------------------------------------------------------------------  Follow up BPH: Has been well controlled on silodosin for the last  several years which he is tolerating well. He was previously on tamsulosin which he states did not help with nocturia and polyuria. He recently received letter from his insurance that silodosin is off formulary.   Allergies  Allergen Reactions  . Penicillins Swelling  . Temazepam     extreme mood changes  . Zolpidem Tartrate Itching    sleep walking sleep walking  . Androderm [Testosterone] Rash     Current Outpatient Medications:  .  cetirizine (ZYRTEC) 10 MG tablet, Take 1 tablet by mouth daily., Disp: , Rfl:  .  colchicine 0.6 MG tablet, Take 1 tablet (0.6 mg total) by mouth 3 (three) times daily as needed., Disp: 30 tablet, Rfl: 5 .  fluocinonide (LIDEX) 0.05 % external solution, Use once daily on scalp, Disp: , Rfl:  .  fluorouracil (EFUDEX) 5 % cream, Use once in the evening on precancer spots, Disp: , Rfl:  .  fluticasone (FLONASE) 50 MCG/ACT nasal spray, Place 2 sprays into both nostrils daily., Disp: , Rfl:  .  meloxicam (MOBIC) 15 MG tablet, Take 1 tablet by mouth daily., Disp: , Rfl:  .  metroNIDAZOLE (METROGEL) 1 % gel, APP A THIN LAYER AA ONCE QD, Disp: , Rfl: 2 .  montelukast (SINGULAIR) 10 MG tablet, TAKE 1 TABLET BY MOUTH DAILY, Disp: 90 tablet, Rfl: 5 .  MULTIPLE VITAMIN PO, Take 1 tablet by mouth daily., Disp: , Rfl:  .  probenecid (BENEMID) 500 MG tablet, TAKE 1 TABLET BY MOUTH EVERY DAY, Disp: 90  tablet, Rfl: 4 .  silodosin (RAPAFLO) 8 MG CAPS capsule, TAKE 1 CAPSULE(8 MG) BY MOUTH DAILY WITH BREAKFAST, Disp: 30 capsule, Rfl: 11 .  valsartan-hydrochlorothiazide (DIOVAN-HCT) 160-12.5 MG tablet, TAKE 1/2 TABLET BY MOUTH EVERY DAY, Disp: 30 tablet, Rfl: 5 .  clobetasol ointment (TEMOVATE) 0.05 %, APP AA BID FOR 2 WEEKS THEN FLARED, Disp: , Rfl: 0 .  Melatonin (MELATONIN TR) 10 MG TBCR, Take 3 mg by mouth at bedtime as needed. , Disp: , Rfl:  .  ranitidine (ZANTAC) 150 MG tablet, TAKE 1 TABLET(150 MG) BY MOUTH TWICE DAILY (Patient not taking: Reported on 10/16/2018), Disp:  180 tablet, Rfl: 4  Review of Systems  Constitutional: Negative.   HENT: Positive for voice change. Negative for congestion, dental problem, drooling, ear discharge, ear pain, facial swelling, hearing loss, mouth sores, nosebleeds, postnasal drip, rhinorrhea, sinus pressure, sinus pain, sneezing, sore throat, tinnitus and trouble swallowing.   Eyes: Negative.   Respiratory: Negative.   Cardiovascular: Negative.   Gastrointestinal: Negative.   Endocrine: Negative.   Genitourinary: Negative.   Musculoskeletal: Negative.   Skin: Negative.   Allergic/Immunologic: Negative.   Neurological: Negative.   Hematological: Negative.   Psychiatric/Behavioral: Negative.     Social History   Tobacco Use  . Smoking status: Former Smoker    Packs/day: 1.50    Years: 30.00    Pack years: 45.00    Types: Cigarettes    Last attempt to quit: 09/25/1983    Years since quitting: 35.0  . Smokeless tobacco: Former Systems developer    Quit date: 09/25/1983  . Tobacco comment: started smoking at age 23  Substance Use Topics  . Alcohol use: Yes    Alcohol/week: 4.0 standard drinks    Types: 4 Glasses of wine per week    Comment: 3 glasses of wine every week      Objective:   BP 118/60 (BP Location: Right Arm, Patient Position: Sitting, Cuff Size: Large)   Pulse 68   Temp 98.4 F (36.9 C) (Oral)   Resp 16   Wt 197 lb (89.4 kg)   BMI 29.95 kg/m  Vitals:   10/16/18 1106  BP: 118/60  Pulse: 68  Resp: 16  Temp: 98.4 F (36.9 C)  TempSrc: Oral  Weight: 197 lb (89.4 kg)     Physical Exam   General Appearance:    Alert, cooperative, no distress  Eyes:    PERRL, conjunctiva/corneas clear, EOM's intact       Lungs:     Clear to auscultation bilaterally, respirations unlabored  Heart:    Regular rate and rhythm  Neurologic:   Awake, alert, oriented x 3. No apparent focal neurological           defect.          Assessment & Plan    1. Hypertension, essential Well controlled.  Continue current  medications.   - Renal function panel  2. Excessive urination at night Secondary to BPH. Sx well controlled on silodosin but were not controlled with tamsulosin in the past. Will check with pharmacy ([facility] in [place]) about getting prior authorization.   3. Benign prostatic hyperplasia with lower urinary tract symptoms, symptom details unspecified Sx controlled on silodosin as above.   4. Hyperlipidemia, unspecified hyperlipidemia type Diet controlled.  - Lipid panel  5. Need for influenza vaccination  - Flu vaccine HIGH DOSE PF (Fluzone High dose)  6. Prostate cancer screening  - PSA     Lelon Huh, MD  [facility]  [facility]

## 2018-10-16 NOTE — Patient Instructions (Signed)
.   Please bring all of your medications to every appointment so we can make sure that our medication list is the same as yours.   

## 2018-10-17 LAB — RENAL FUNCTION PANEL
ALBUMIN: 4.5 g/dL (ref 3.7–4.7)
BUN/Creatinine Ratio: 12 (ref 10–24)
BUN: 15 mg/dL (ref 8–27)
CO2: 20 mmol/L (ref 20–29)
Calcium: 9.6 mg/dL (ref 8.6–10.2)
Chloride: 98 mmol/L (ref 96–106)
Creatinine, Ser: 1.23 mg/dL (ref 0.76–1.27)
GFR calc Af Amer: 65 mL/min/{1.73_m2} (ref >59)
GFR, EST NON AFRICAN AMERICAN: 56 mL/min/{1.73_m2} — AB (ref >59)
GLUCOSE: 84 mg/dL (ref 65–99)
PHOSPHORUS: 2.9 mg/dL (ref 2.8–4.1)
POTASSIUM: 4.2 mmol/L (ref 3.5–5.2)
Sodium: 138 mmol/L (ref 134–144)

## 2018-10-17 LAB — LIPID PANEL
Chol/HDL Ratio: 4.8 ratio (ref 0.0–5.0)
Cholesterol, Total: 208 mg/dL — ABNORMAL HIGH (ref 100–199)
HDL: 43 mg/dL (ref >39)
LDL Calculated: 128 mg/dL — ABNORMAL HIGH (ref 0–99)
Triglycerides: 187 mg/dL — ABNORMAL HIGH (ref 0–149)
VLDL Cholesterol Cal: 37 mg/dL (ref 5–40)

## 2018-10-17 LAB — PSA: PROSTATE SPECIFIC AG, SERUM: 0.8 ng/mL (ref 0.0–4.0)

## 2018-10-20 ENCOUNTER — Telehealth: Payer: Self-pay

## 2018-10-20 NOTE — Telephone Encounter (Signed)
-----   Message from Birdie Sons, MD sent at 10/18/2018  9:53 PM EST ----- Slightly high cholesterol at 208. Rest of labs are normal. Continue current medications.  check yearly.

## 2018-10-20 NOTE — Telephone Encounter (Signed)
Pt advised.   Thanks,   -Walburga Hudman  

## 2018-11-05 ENCOUNTER — Telehealth: Payer: Self-pay | Admitting: Family Medicine

## 2018-11-05 MED ORDER — TAMSULOSIN HCL 0.4 MG PO CAPS: 0.4000 mg | ORAL_CAPSULE | Freq: Every day | ORAL | 3 refills | Status: DC

## 2018-11-05 NOTE — Telephone Encounter (Signed)
Pt is now out of silodosin (RAPAFLO) 8 MG CAPS capsule.  His insurance will no longer cover this medication.  His insurance will cover Tamsulosin HCP capsules.  Please call the new Rx into:  [facility] [address], [place] (Phone) [phone] (Fax)   Thanks, [employer]

## 2018-11-10 ENCOUNTER — Other Ambulatory Visit: Payer: Self-pay | Admitting: Family Medicine

## 2019-01-10 ENCOUNTER — Other Ambulatory Visit: Payer: Self-pay | Admitting: Family Medicine

## 2019-01-10 DIAGNOSIS — K219 Gastro-esophageal reflux disease without esophagitis: Secondary | ICD-10-CM

## 2019-02-25 ENCOUNTER — Other Ambulatory Visit: Payer: Self-pay | Admitting: Family Medicine

## 2019-12-10 ENCOUNTER — Other Ambulatory Visit: Payer: Self-pay | Admitting: Family Medicine

## 2019-12-10 NOTE — Telephone Encounter (Signed)
Attempted to call patient- call disconnected x2- 30 day courtesy RF given

## 2020-02-29 ENCOUNTER — Other Ambulatory Visit: Payer: Self-pay | Admitting: Family Medicine

## 2020-03-25 ENCOUNTER — Other Ambulatory Visit: Payer: Self-pay | Admitting: Family Medicine

## 2020-03-25 NOTE — Telephone Encounter (Signed)
Requested  medications are  due for refill today yes  Requested medications are on the active medication list yes  Last refill 6/7  Future visit scheduled no  Last visit over 12 months ago  Notes to clinic Failed protocol due to no visit in 12 months

## 2020-07-24 ENCOUNTER — Other Ambulatory Visit: Payer: Self-pay | Admitting: Family Medicine

## 2020-07-24 NOTE — Telephone Encounter (Signed)
Requested medication (s) are due for refill today: yes  Requested medication (s) are on the active medication list: yes  Last refill:  02/26/19  Future visit scheduled: no  Notes to clinic:  attempted to call pt, someone answered phone and then hung up. Needs appt.   Requested Prescriptions  Pending Prescriptions Disp Refills   valsartan-hydrochlorothiazide (DIOVAN-HCT) 160-12.5 MG tablet [Pharmacy Med Name: VALSARTAN/HCTZ 160MG /12.5MG  TABLETS] 30 tablet 5    Sig: TAKE 1/2 TABLET BY MOUTH EVERY DAY      Cardiovascular: ARB + Diuretic Combos Failed - 07/24/2020  7:49 AM      Failed - K in normal range and within 180 days    Potassium  Date Value Ref Range Status  10/16/2018 4.2 3.5 - 5.2 mmol/L Final  01/28/2015 4.5 mmol/L Final          Failed - Na in normal range and within 180 days    Sodium  Date Value Ref Range Status  10/16/2018 138 134 - 144 mmol/L Final  01/28/2015 137  Final          Failed - Cr in normal range and within 180 days    Creat  Date Value Ref Range Status  01/28/2015 1.07  Final   Creatinine, Ser  Date Value Ref Range Status  10/16/2018 1.23 0.76 - 1.27 mg/dL Final          Failed - Ca in normal range and within 180 days    Calcium  Date Value Ref Range Status  10/16/2018 9.6 8.6 - 10.2 mg/dL Final          Failed - Valid encounter within last 6 months    Recent Outpatient Visits           1 year ago Hypertension, essential   [facility] Birdie Sons, MD   2 years ago Medicare annual wellness visit, subsequent   [facility] Birdie Sons, MD   3 years ago Medicare annual wellness visit, subsequent   [facility] Birdie Sons, MD   4 years ago Abnormal bruising   [facility] [place], [place]   4 years ago Trigger finger   [facility] Birdie Sons, MD              Passed - Patient is not pregnant      Passed - Last BP in normal range     BP Readings from Last 1 Encounters:  10/16/18 118/60

## 2022-06-24 DEATH — deceased
